# Patient Record
Sex: Male | Born: 1985 | Race: Black or African American | Hispanic: No | Marital: Single | Smoking: Current every day smoker
Health system: Southern US, Community
[De-identification: ages and names within clinical notes are randomized; demographics above are authoritative.]

## PROBLEM LIST (undated history)

## (undated) DIAGNOSIS — M549 Dorsalgia, unspecified: Secondary | ICD-10-CM

## (undated) DIAGNOSIS — R0602 Shortness of breath: Secondary | ICD-10-CM

## (undated) DIAGNOSIS — F259 Schizoaffective disorder, unspecified: Secondary | ICD-10-CM

## (undated) DIAGNOSIS — F250 Schizoaffective disorder, bipolar type: Secondary | ICD-10-CM

## (undated) DIAGNOSIS — E669 Obesity, unspecified: Secondary | ICD-10-CM

## (undated) DIAGNOSIS — I213 ST elevation (STEMI) myocardial infarction of unspecified site: Secondary | ICD-10-CM

## (undated) DIAGNOSIS — G8929 Other chronic pain: Secondary | ICD-10-CM

## (undated) DIAGNOSIS — F319 Bipolar disorder, unspecified: Secondary | ICD-10-CM

## (undated) DIAGNOSIS — M546 Pain in thoracic spine: Secondary | ICD-10-CM

## (undated) DIAGNOSIS — M1990 Unspecified osteoarthritis, unspecified site: Secondary | ICD-10-CM

---
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
## 2011-02-27 ENCOUNTER — Inpatient Hospital Stay (INDEPENDENT_AMBULATORY_CARE_PROVIDER_SITE_OTHER)
Admission: RE | Admit: 2011-02-27 | Discharge: 2011-02-27 | Disposition: A | Discharge status: Home / Self Care | Payer: Medicaid Other | Source: Ambulatory Visit | Attending: Family Medicine | Admitting: Family Medicine

## 2011-02-27 DIAGNOSIS — T300 Burn of unspecified body region, unspecified degree: Secondary | ICD-10-CM

## 2013-01-18 ENCOUNTER — Emergency Department (HOSPITAL_COMMUNITY)
Admission: EM | Admit: 2013-01-18 | Discharge: 2013-01-18 | Disposition: A | Discharge status: Home / Self Care | Payer: Medicaid Other | Attending: Emergency Medicine | Admitting: Emergency Medicine

## 2013-01-18 ENCOUNTER — Encounter (HOSPITAL_COMMUNITY): Payer: Self-pay | Admitting: Cardiology

## 2013-01-18 DIAGNOSIS — F17200 Nicotine dependence, unspecified, uncomplicated: Secondary | ICD-10-CM | POA: Insufficient documentation

## 2013-01-18 DIAGNOSIS — Y92410 Unspecified street and highway as the place of occurrence of the external cause: Secondary | ICD-10-CM | POA: Insufficient documentation

## 2013-01-18 DIAGNOSIS — S8990XA Unspecified injury of unspecified lower leg, initial encounter: Secondary | ICD-10-CM | POA: Insufficient documentation

## 2013-01-18 DIAGNOSIS — M549 Dorsalgia, unspecified: Secondary | ICD-10-CM

## 2013-01-18 DIAGNOSIS — Y9389 Activity, other specified: Secondary | ICD-10-CM | POA: Insufficient documentation

## 2013-01-18 DIAGNOSIS — S99919A Unspecified injury of unspecified ankle, initial encounter: Secondary | ICD-10-CM | POA: Insufficient documentation

## 2013-01-18 DIAGNOSIS — IMO0002 Reserved for concepts with insufficient information to code with codable children: Secondary | ICD-10-CM | POA: Insufficient documentation

## 2013-01-18 DIAGNOSIS — S0993XA Unspecified injury of face, initial encounter: Secondary | ICD-10-CM | POA: Insufficient documentation

## 2013-01-18 MED ORDER — METHOCARBAMOL 500 MG PO TABS: 500.0000 mg | ORAL_TABLET | Freq: Two times a day (BID) | ORAL | Status: DC | PRN

## 2013-01-18 MED ORDER — HYDROCODONE-ACETAMINOPHEN 5-325 MG PO TABS: 1.0000 | ORAL_TABLET | ORAL | Status: DC | PRN
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
## 2013-01-19 NOTE — ED Provider Notes (Signed)
Medical screening examination/treatment/procedure(s) were performed by non-physician practitioner and as supervising physician I was immediately available for consultation/collaboration.  [name], MD 01/19/13 [phone]

## 2013-02-18 ENCOUNTER — Encounter (HOSPITAL_COMMUNITY): Payer: Self-pay | Admitting: Emergency Medicine

## 2013-02-18 ENCOUNTER — Emergency Department (HOSPITAL_COMMUNITY): Payer: Medicaid Other

## 2013-02-18 ENCOUNTER — Observation Stay (HOSPITAL_COMMUNITY)
Admission: EM | Admit: 2013-02-18 | Discharge: 2013-02-20 | Disposition: A | Discharge status: Home / Self Care | Payer: Medicaid Other | Attending: Internal Medicine | Admitting: Internal Medicine

## 2013-02-18 DIAGNOSIS — F17200 Nicotine dependence, unspecified, uncomplicated: Secondary | ICD-10-CM

## 2013-02-18 DIAGNOSIS — F1010 Alcohol abuse, uncomplicated: Secondary | ICD-10-CM | POA: Insufficient documentation

## 2013-02-18 DIAGNOSIS — I2510 Atherosclerotic heart disease of native coronary artery without angina pectoris: Secondary | ICD-10-CM | POA: Insufficient documentation

## 2013-02-18 DIAGNOSIS — Z720 Tobacco use: Secondary | ICD-10-CM

## 2013-02-18 DIAGNOSIS — R7989 Other specified abnormal findings of blood chemistry: Secondary | ICD-10-CM | POA: Diagnosis present

## 2013-02-18 DIAGNOSIS — M549 Dorsalgia, unspecified: Secondary | ICD-10-CM

## 2013-02-18 DIAGNOSIS — S20219A Contusion of unspecified front wall of thorax, initial encounter: Secondary | ICD-10-CM

## 2013-02-18 DIAGNOSIS — F1210 Cannabis abuse, uncomplicated: Secondary | ICD-10-CM | POA: Insufficient documentation

## 2013-02-18 DIAGNOSIS — Z6839 Body mass index (BMI) 39.0-39.9, adult: Secondary | ICD-10-CM | POA: Insufficient documentation

## 2013-02-18 DIAGNOSIS — R748 Abnormal levels of other serum enzymes: Secondary | ICD-10-CM | POA: Insufficient documentation

## 2013-02-18 DIAGNOSIS — S2691XA Contusion of heart, unspecified with or without hemopericardium, initial encounter: Secondary | ICD-10-CM

## 2013-02-18 DIAGNOSIS — R072 Precordial pain: Secondary | ICD-10-CM | POA: Insufficient documentation

## 2013-02-18 DIAGNOSIS — D751 Secondary polycythemia: Secondary | ICD-10-CM | POA: Insufficient documentation

## 2013-02-18 DIAGNOSIS — I219 Acute myocardial infarction, unspecified: Principal | ICD-10-CM | POA: Insufficient documentation

## 2013-02-18 DIAGNOSIS — Z8659 Personal history of other mental and behavioral disorders: Secondary | ICD-10-CM

## 2013-02-18 DIAGNOSIS — S2611XD Contusion of heart without hemopericardium, subsequent encounter: Secondary | ICD-10-CM

## 2013-02-18 DIAGNOSIS — F3110 Bipolar disorder, current episode manic without psychotic features, unspecified: Secondary | ICD-10-CM | POA: Insufficient documentation

## 2013-02-18 DIAGNOSIS — F319 Bipolar disorder, unspecified: Secondary | ICD-10-CM | POA: Insufficient documentation

## 2013-02-18 DIAGNOSIS — I213 ST elevation (STEMI) myocardial infarction of unspecified site: Secondary | ICD-10-CM

## 2013-02-18 DIAGNOSIS — F1290 Cannabis use, unspecified, uncomplicated: Secondary | ICD-10-CM | POA: Diagnosis present

## 2013-02-18 DIAGNOSIS — R0789 Other chest pain: Secondary | ICD-10-CM

## 2013-02-18 DIAGNOSIS — E669 Obesity, unspecified: Secondary | ICD-10-CM | POA: Diagnosis present

## 2013-02-18 DIAGNOSIS — E860 Dehydration: Secondary | ICD-10-CM | POA: Insufficient documentation

## 2013-02-18 HISTORY — DX: ST elevation (STEMI) myocardial infarction of unspecified site: I21.3

## 2013-02-18 HISTORY — DX: Dorsalgia, unspecified: M54.9

## 2013-02-18 HISTORY — DX: Bipolar disorder, unspecified: F31.9

## 2013-02-18 HISTORY — DX: Schizoaffective disorder, unspecified: F25.9

## 2013-02-18 HISTORY — DX: Schizoaffective disorder, bipolar type: F25.0

## 2013-02-18 HISTORY — DX: Unspecified osteoarthritis, unspecified site: M19.90

## 2013-02-18 HISTORY — DX: Other chronic pain: G89.29

## 2013-02-18 HISTORY — DX: Pain in thoracic spine: M54.6

## 2013-02-18 HISTORY — DX: Shortness of breath: R06.02

## 2013-02-18 HISTORY — DX: Obesity, unspecified: E66.9

## 2013-02-18 LAB — POCT I-STAT TROPONIN I

## 2013-02-18 LAB — RAPID URINE DRUG SCREEN, HOSP PERFORMED
Barbiturates: NOT DETECTED
Benzodiazepines: NOT DETECTED
Cocaine: NOT DETECTED
Opiates: NOT DETECTED

## 2013-02-18 LAB — POCT I-STAT, CHEM 8
BUN: 7 mg/dL (ref 6–23)
Calcium, Ion: 1.1 mmol/L — ABNORMAL LOW (ref 1.12–1.23)
Chloride: 103 mEq/L (ref 96–112)
Creatinine, Ser: 1 mg/dL (ref 0.50–1.35)
Glucose, Bld: 124 mg/dL — ABNORMAL HIGH (ref 70–99)
TCO2: 26 mmol/L (ref 0–100)

## 2013-02-18 LAB — TROPONIN I: Troponin I: 1.58 ng/mL (ref <0.30)

## 2013-02-18 MED ORDER — METHOCARBAMOL 500 MG PO TABS
500.0000 mg | ORAL_TABLET | Freq: Two times a day (BID) | ORAL | Status: DC | PRN
  Administered 2013-02-19: 500 mg via ORAL
  Filled 2013-02-18: qty 1

## 2013-02-18 MED ORDER — SODIUM CHLORIDE 0.9 % IJ SOLN: 3.0000 mL | Freq: Two times a day (BID) | INTRAMUSCULAR | Status: DC

## 2013-02-18 MED ORDER — SODIUM CHLORIDE 0.9 % IV BOLUS (SEPSIS)
1000.0000 mL | Freq: Once | INTRAVENOUS | Status: DC
  Administered 2013-02-18: 1000 mL via INTRAVENOUS

## 2013-02-18 MED ORDER — RISPERIDONE MICROSPHERES 25 MG IM SUSR: 25.0000 mg | INTRAMUSCULAR | Status: DC

## 2013-02-18 MED ORDER — IOHEXOL 300 MG/ML  SOLN
100.0000 mL | Freq: Once | INTRAMUSCULAR | Status: AC | PRN
  Administered 2013-02-18: 100 mL via INTRAVENOUS
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
## 2013-02-18 NOTE — ED Notes (Signed)
Act  at bedside. 
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
## 2013-02-18 NOTE — [facility] Assessment (Signed)
[facility] Assessment Progress Note   This clinician did talk to patient regarding his services as requested by the PA.  Patient said that he received ACTT team services from [facility] and gave verbal permission to call them.  Clinician did call them and spoke to [employer].  She verified that patient is up to date with psychiatric medications and is compliant with services that they provide.  Patient was last seen by Megan yesterday morning (04/28) briefly and she said that she also talked to patient's mother today.  Patient has a appt with his PCP later in the week that they will take him to.  Patient denies any SI or HI to this clinician.
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
## 2013-02-18 NOTE — ED Notes (Signed)
MD at bedside. 
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
## 2013-02-18 NOTE — ED Notes (Addendum)
Elevated i stat troponin results reported to Dr. Manus Gunning by B. Bing Plume, EMT (1.17ng/mL)
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
## 2013-02-18 NOTE — ED Notes (Signed)
IV access attempted x2.  Second RN in to attempt.

## 2013-02-18 NOTE — ED Notes (Signed)
1st EKG was performed at 1414 by Steward Drone, RN, shown to [place], MD and was asked to repeat the EKG; I repeated the EKG and the 2nd was performed by me at 1422 and shown to [place], MD

## 2013-02-19 ENCOUNTER — Encounter (HOSPITAL_COMMUNITY): Payer: Self-pay | Admitting: General Practice

## 2013-02-19 DIAGNOSIS — Z5189 Encounter for other specified aftercare: Secondary | ICD-10-CM

## 2013-02-19 DIAGNOSIS — R7989 Other specified abnormal findings of blood chemistry: Secondary | ICD-10-CM

## 2013-02-19 DIAGNOSIS — S2691XA Contusion of heart, unspecified with or without hemopericardium, initial encounter: Secondary | ICD-10-CM

## 2013-02-19 DIAGNOSIS — S20219A Contusion of unspecified front wall of thorax, initial encounter: Secondary | ICD-10-CM

## 2013-02-19 DIAGNOSIS — I517 Cardiomegaly: Secondary | ICD-10-CM

## 2013-02-19 DIAGNOSIS — R071 Chest pain on breathing: Secondary | ICD-10-CM

## 2013-02-19 DIAGNOSIS — F17200 Nicotine dependence, unspecified, uncomplicated: Secondary | ICD-10-CM

## 2013-02-19 DIAGNOSIS — F1290 Cannabis use, unspecified, uncomplicated: Secondary | ICD-10-CM | POA: Diagnosis present

## 2013-02-19 DIAGNOSIS — Z720 Tobacco use: Secondary | ICD-10-CM | POA: Diagnosis present

## 2013-02-19 DIAGNOSIS — Z8659 Personal history of other mental and behavioral disorders: Secondary | ICD-10-CM

## 2013-02-19 LAB — CK TOTAL AND CKMB (NOT AT ARMC)
CK, MB: 58.1 ng/mL (ref 0.3–4.0)
Relative Index: 4.5 — ABNORMAL HIGH (ref 0.0–2.5)
Total CK: 1294 U/L — ABNORMAL HIGH (ref 7–232)

## 2013-02-19 LAB — BASIC METABOLIC PANEL
CO2: 26 mEq/L (ref 19–32)
Calcium: 8.9 mg/dL (ref 8.4–10.5)
Chloride: 103 mEq/L (ref 96–112)
Creatinine, Ser: 0.98 mg/dL (ref 0.50–1.35)
Glucose, Bld: 124 mg/dL — ABNORMAL HIGH (ref 70–99)

## 2013-02-19 LAB — CBC
Hemoglobin: 16.1 g/dL (ref 13.0–17.0)
MCH: 28.1 pg (ref 26.0–34.0)
MCV: 80.9 fL (ref 78.0–100.0)
Platelets: 262 10*3/uL (ref 150–400)
RBC: 5.72 MIL/uL (ref 4.22–5.81)
WBC: 12.1 10*3/uL — ABNORMAL HIGH (ref 4.0–10.5)

## 2013-02-19 LAB — TROPONIN I
Troponin I: 13.49 ng/mL (ref <0.30)
Troponin I: 14.43 ng/mL (ref <0.30)

## 2013-02-19 MED ORDER — POTASSIUM CHLORIDE CRYS ER 20 MEQ PO TBCR
20.0000 meq | EXTENDED_RELEASE_TABLET | Freq: Two times a day (BID) | ORAL | Status: AC
  Administered 2013-02-19 (×2): 20 meq via ORAL
  Filled 2013-02-19 (×2): qty 1

## 2013-02-19 MED ORDER — POTASSIUM CHLORIDE IN NACL 20-0.45 MEQ/L-% IV SOLN
INTRAVENOUS | Status: DC
  Administered 2013-02-19: 09:00:00 via INTRAVENOUS
  Filled 2013-02-19 (×6): qty 1000
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
## 2013-02-19 NOTE — Progress Notes (Signed)
  Echocardiogram 2D Echocardiogram has been performed.  Georgian Co 02/19/2013, 10:19 AM

## 2013-02-19 NOTE — Progress Notes (Signed)
Resulted labs text messaged to Dr Sherrie Mustache, Troponin 14.43, CK 1294, CKMB 58.1

## 2013-02-19 NOTE — ED Provider Notes (Signed)
I was available for the conclusion of this patient's care.  Please see the initial physician note for additional information about the patient's course.  Gerhard Munch, MD 02/19/13 0000

## 2013-02-19 NOTE — Progress Notes (Signed)
Utilization review completed.  

## 2013-02-20 ENCOUNTER — Encounter (HOSPITAL_COMMUNITY)
Admission: EM | Disposition: A | Discharge status: Home / Self Care | Payer: Self-pay | Source: Home / Self Care | Attending: Emergency Medicine

## 2013-02-20 ENCOUNTER — Telehealth: Payer: Self-pay | Admitting: Physician Assistant

## 2013-02-20 ENCOUNTER — Encounter (HOSPITAL_COMMUNITY): Payer: Self-pay | Admitting: Internal Medicine

## 2013-02-20 DIAGNOSIS — I2510 Atherosclerotic heart disease of native coronary artery without angina pectoris: Secondary | ICD-10-CM

## 2013-02-20 DIAGNOSIS — I213 ST elevation (STEMI) myocardial infarction of unspecified site: Secondary | ICD-10-CM

## 2013-02-20 DIAGNOSIS — E669 Obesity, unspecified: Secondary | ICD-10-CM

## 2013-02-20 DIAGNOSIS — M549 Dorsalgia, unspecified: Secondary | ICD-10-CM

## 2013-02-20 DIAGNOSIS — I10 Essential (primary) hypertension: Secondary | ICD-10-CM

## 2013-02-20 HISTORY — DX: ST elevation (STEMI) myocardial infarction of unspecified site: I21.3

## 2013-02-20 HISTORY — PX: LEFT HEART CATHETERIZATION WITH CORONARY ANGIOGRAM: SHX5451

## 2013-02-20 HISTORY — DX: Obesity, unspecified: E66.9

## 2013-02-20 LAB — BASIC METABOLIC PANEL
BUN: 6 mg/dL (ref 6–23)
Chloride: 103 mEq/L (ref 96–112)
Creatinine, Ser: 1.02 mg/dL (ref 0.50–1.35)
GFR calc Af Amer: >90 mL/min (ref >90)
GFR calc non Af Amer: >90 mL/min (ref >90)
Potassium: 3.9 mEq/L (ref 3.5–5.1)

## 2013-02-20 LAB — CBC
MCHC: 33.8 g/dL (ref 30.0–36.0)
Platelets: 256 10*3/uL (ref 150–400)
RDW: 14 % (ref 11.5–15.5)
WBC: 10.5 10*3/uL (ref 4.0–10.5)

## 2013-02-20 LAB — CK TOTAL AND CKMB (NOT AT ARMC): Total CK: 619 U/L — ABNORMAL HIGH (ref 7–232)

## 2013-02-20 LAB — PROTIME-INR: Prothrombin Time: 13.2 seconds (ref 11.6–15.2)

## 2013-02-20 SURGERY — LEFT HEART CATHETERIZATION WITH CORONARY ANGIOGRAM
Anesthesia: LOCAL

## 2013-02-20 MED ORDER — FENTANYL CITRATE 0.05 MG/ML IJ SOLN
INTRAMUSCULAR | Status: AC
  Filled 2013-02-20: qty 2

## 2013-02-20 MED ORDER — HEPARIN (PORCINE) IN NACL 2-0.9 UNIT/ML-% IJ SOLN
INTRAMUSCULAR | Status: AC
  Filled 2013-02-20: qty 1500

## 2013-02-20 MED ORDER — LOSARTAN POTASSIUM 50 MG PO TABS
50.0000 mg | ORAL_TABLET | Freq: Every day | ORAL | Status: DC
  Filled 2013-02-20: qty 1

## 2013-02-20 MED ORDER — ASPIRIN 81 MG PO CHEW
324.0000 mg | CHEWABLE_TABLET | ORAL | Status: DC
Start: 2013-02-21 — End: 2013-02-20

## 2013-02-20 MED ORDER — SODIUM CHLORIDE 0.9 % IJ SOLN: 3.0000 mL | INTRAMUSCULAR | Status: DC | PRN

## 2013-02-20 MED ORDER — METOPROLOL SUCCINATE ER 25 MG PO TB24: 50.0000 mg | ORAL_TABLET | Freq: Every day | ORAL | Status: DC

## 2013-02-20 MED ORDER — SODIUM CHLORIDE 0.9 % IJ SOLN: 3.0000 mL | Freq: Two times a day (BID) | INTRAMUSCULAR | Status: DC

## 2013-02-20 MED ORDER — VERAPAMIL HCL 2.5 MG/ML IV SOLN
INTRAVENOUS | Status: AC
  Filled 2013-02-20: qty 2

## 2013-02-20 MED ORDER — ONDANSETRON HCL 4 MG/2ML IJ SOLN: 4.0000 mg | Freq: Four times a day (QID) | INTRAMUSCULAR | Status: DC | PRN

## 2013-02-20 MED ORDER — HEPARIN SODIUM (PORCINE) 1000 UNIT/ML IJ SOLN
INTRAMUSCULAR | Status: AC
  Filled 2013-02-20: qty 1

## 2013-02-20 MED ORDER — SODIUM CHLORIDE 0.9 % IV SOLN: 250.0000 mL | INTRAVENOUS | Status: DC | PRN

## 2013-02-20 MED ORDER — METOPROLOL SUCCINATE ER 25 MG PO TB24
25.0000 mg | ORAL_TABLET | Freq: Every day | ORAL | Status: DC
  Filled 2013-02-20: qty 1

## 2013-02-20 MED ORDER — ASPIRIN 81 MG PO CHEW: 81.0000 mg | CHEWABLE_TABLET | Freq: Every day | ORAL | Status: DC

## 2013-02-20 MED ORDER — LIDOCAINE HCL (PF) 1 % IJ SOLN
INTRAMUSCULAR | Status: AC
  Filled 2013-02-20: qty 30

## 2013-02-20 MED ORDER — ACETAMINOPHEN 325 MG PO TABS: 650.0000 mg | ORAL_TABLET | ORAL | Status: DC | PRN

## 2013-02-20 MED ORDER — ASPIRIN 81 MG PO CHEW
81.0000 mg | CHEWABLE_TABLET | Freq: Every day | ORAL | Status: DC
Start: 2013-02-20 — End: 2013-02-20
  Administered 2013-02-20: 81 mg via ORAL
  Filled 2013-02-20: qty 1

## 2013-02-20 MED ORDER — SODIUM CHLORIDE 0.9 % IV SOLN
1.0000 mL/kg/h | INTRAVENOUS | Status: DC
  Administered 2013-02-20: 1 mL/kg/h via INTRAVENOUS

## 2013-02-20 MED ORDER — MIDAZOLAM HCL 2 MG/2ML IJ SOLN
INTRAMUSCULAR | Status: AC
  Filled 2013-02-20: qty 2

## 2013-02-20 NOTE — Progress Notes (Signed)
Nutrition Brief Note  Patient identified on the Malnutrition Screening Tool (MST) Report for recent weight loss without trying; desirable given obesity.  Body mass index is 39.15 kg/(m^2). Patient meets criteria for Obesity Class II based on current BMI.   Current diet order is Regular, patient is consuming approximately 100% of meals at this time. Labs and medications reviewed.   No nutrition interventions warranted at this time. If nutrition issues arise, please consult RD.   Maureen Chatters, RD, LDN Pager #: [phone] After-Hours Pager #: [phone]
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
## 2013-02-20 NOTE — Progress Notes (Signed)
CSW was referred this pt to speak to him about seeing a counselor regarding anger issues with his mom or any other family members. We talked about the positive THC and amphetamines that were in his system. The pt didn't respond to the positive drugs found in his labs.  Pt then stated that he needed to go to the bathroom, CSW left the room.   Sherald Barge, LCSW-A Clinical Social Worker [phone]

## 2013-02-20 NOTE — Telephone Encounter (Signed)
New problem   14 day TCM made with Tereso Newcomer per Thayer Ohm calling/seen for Dr [name] on 03/06/13 @ 3:20.

## 2013-02-20 NOTE — CV Procedure (Signed)
   Cardiac Catheterization Procedure Note  Name: Jake Sanchez MRN: [id number] DOB: 05-27-1986  Procedure: Left Heart Cath, Selective Coronary Angiography, LV angiography  Indication: elevated troponin   Procedural Details: The right wrist was prepped, draped, and anesthetized with 1% lidocaine. Using the modified Seldinger technique, a 5 French sheath was introduced into the right radial artery. 3 mg of verapamil was administered through the sheath, weight-based unfractionated heparin was administered intravenously. Standard Judkins catheters were used for selective coronary angiography and left ventriculography. Catheter exchanges were performed over an exchange length guidewire. There were no immediate procedural complications. A TR band was used for radial hemostasis at the completion of the procedure.  The patient was transferred to the post catheterization recovery area for further monitoring.  Procedural Findings: Hemodynamics: AO 126/79 LV 137/13  Coronary angiography: Coronary dominance: right  Left mainstem: Widely patent, no obstructive disease.  Left anterior descending (LAD): Widely patent to the LV apex. There is nonobstructive 40-50% stenosis after the first diagonal branch of the LAD.  Left circumflex (LCx): Widely patent with 2 OM branches. No obstructive disease.  Right coronary artery (RCA): Dominant, patent throughout.  Left ventriculography: Left ventricular systolic function is hyperdynamic, LVEF is estimated at 70%, there is no significant mitral regurgitation   Final Conclusions:   1. Nonobstructive LAD stenosis 2. Wide patency of the LCx and RCA 3. Hyperdynamic LV function  Recommendations: Med Rx for nonobstructive CAD.  Tonny Bollman 02/20/2013, 9:45 AM

## 2013-02-21 NOTE — Telephone Encounter (Signed)
TCM management call.  No answer/no voicemail.

## 2013-02-22 ENCOUNTER — Encounter (HOSPITAL_COMMUNITY): Payer: Self-pay

## 2013-02-22 ENCOUNTER — Emergency Department (HOSPITAL_COMMUNITY)
Admission: EM | Admit: 2013-02-22 | Discharge: 2013-02-23 | Disposition: A | Discharge status: Home / Self Care | Payer: No Typology Code available for payment source | Attending: Emergency Medicine | Admitting: Emergency Medicine

## 2013-02-22 DIAGNOSIS — M546 Pain in thoracic spine: Secondary | ICD-10-CM | POA: Insufficient documentation

## 2013-02-22 DIAGNOSIS — F319 Bipolar disorder, unspecified: Secondary | ICD-10-CM | POA: Insufficient documentation

## 2013-02-22 DIAGNOSIS — M549 Dorsalgia, unspecified: Secondary | ICD-10-CM | POA: Insufficient documentation

## 2013-02-22 DIAGNOSIS — R0602 Shortness of breath: Secondary | ICD-10-CM | POA: Insufficient documentation

## 2013-02-22 DIAGNOSIS — F259 Schizoaffective disorder, unspecified: Secondary | ICD-10-CM | POA: Insufficient documentation

## 2013-02-22 DIAGNOSIS — E669 Obesity, unspecified: Secondary | ICD-10-CM | POA: Insufficient documentation

## 2013-02-22 DIAGNOSIS — Z79899 Other long term (current) drug therapy: Secondary | ICD-10-CM | POA: Insufficient documentation

## 2013-02-22 DIAGNOSIS — Y998 Other external cause status: Secondary | ICD-10-CM | POA: Insufficient documentation

## 2013-02-22 DIAGNOSIS — IMO0001 Reserved for inherently not codable concepts without codable children: Secondary | ICD-10-CM | POA: Insufficient documentation

## 2013-02-22 DIAGNOSIS — M129 Arthropathy, unspecified: Secondary | ICD-10-CM | POA: Insufficient documentation

## 2013-02-22 DIAGNOSIS — I219 Acute myocardial infarction, unspecified: Secondary | ICD-10-CM | POA: Insufficient documentation

## 2013-02-22 DIAGNOSIS — Z880 Allergy status to penicillin: Secondary | ICD-10-CM | POA: Insufficient documentation

## 2013-02-22 DIAGNOSIS — G8929 Other chronic pain: Secondary | ICD-10-CM | POA: Insufficient documentation

## 2013-02-22 DIAGNOSIS — F17200 Nicotine dependence, unspecified, uncomplicated: Secondary | ICD-10-CM | POA: Insufficient documentation

## 2013-02-22 DIAGNOSIS — Z7982 Long term (current) use of aspirin: Secondary | ICD-10-CM | POA: Insufficient documentation

## 2013-02-22 DIAGNOSIS — Y92410 Unspecified street and highway as the place of occurrence of the external cause: Secondary | ICD-10-CM | POA: Insufficient documentation

## 2013-02-22 DIAGNOSIS — M542 Cervicalgia: Secondary | ICD-10-CM | POA: Insufficient documentation
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
## 2013-02-23 MED ORDER — NAPROXEN 500 MG PO TABS: 500.0000 mg | ORAL_TABLET | Freq: Two times a day (BID) | ORAL | Status: DC

## 2013-02-23 NOTE — ED Provider Notes (Signed)
Medical screening examination/treatment/procedure(s) were performed by non-physician practitioner and as supervising physician I was immediately available for consultation/collaboration.  Doug Sou, MD 02/23/13 [phone]
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
## 2013-02-24 NOTE — Telephone Encounter (Signed)
Attempted to call patient.  Patient's VM has not been set up and so I was unable to LM for patient to call us back; there are no other numbers for patient.  Patient has requested no medical information to be given to anyone without his permission so I did not call patient's mother who is listed as emergency contact.

## 2013-02-24 NOTE — Telephone Encounter (Signed)
Tried to call again at 4:30 and got same message - that patient's VM has not been set up

## 2013-03-06 ENCOUNTER — Encounter: Payer: Medicaid Other | Admitting: Physician Assistant

## 2013-05-20 ENCOUNTER — Encounter: Payer: Self-pay | Admitting: Internal Medicine
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
## 2014-10-01 ENCOUNTER — Encounter (HOSPITAL_COMMUNITY): Payer: Self-pay | Admitting: Cardiovascular Disease

## 2016-01-31 ENCOUNTER — Encounter (HOSPITAL_COMMUNITY): Payer: Self-pay | Admitting: *Deleted

## 2016-01-31 ENCOUNTER — Ambulatory Visit (HOSPITAL_COMMUNITY)
Admission: EM | Admit: 2016-01-31 | Discharge: 2016-01-31 | Disposition: A | Discharge status: Home / Self Care | Payer: Medicaid Other | Attending: Emergency Medicine | Admitting: Emergency Medicine

## 2016-01-31 DIAGNOSIS — L293 Anogenital pruritus, unspecified: Secondary | ICD-10-CM

## 2016-01-31 DIAGNOSIS — L290 Pruritus ani: Secondary | ICD-10-CM

## 2016-01-31 MED ORDER — HYDROCORTISONE 2.5 % RE CREA: TOPICAL_CREAM | RECTAL | Status: DC
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
## 2016-01-31 NOTE — ED Notes (Signed)
Pt  Reports     He has  Had  A  History  Of  hpv      In  Past     He  Has  Warts  In his  Perineal  Area   He  Reports  He  Has  Had  Them  Burned  Off    In  Past   he  Reports  Some  Rectal bleeding  That  He  Noticed  Last  Pm

## 2016-01-31 NOTE — Discharge Instructions (Signed)
My apologies that you were in convinced today, with the impression that the service you are requesting is performed at this center.  A rx for anti itching cream has been provided to you  You [facility] as soon as your medicaid comes through.   You may also use witch hazel to the itching area to help with symptoms.    Anal Pruritus Anal pruritus is when the area around the opening of the butt (anus) feels itchy. This itchy feeling often happens when the area is moist. Moisture may come from sweat or from a small amount of poop (stool) that is left on the area. Scratching can cause more skin damage. HOME CARE Pay attention to any changes in your condition. Take these actions to help with your itching: Skin Care  Keep clean by washing your body well.  Clean the affected area with wet toilet paper, baby wipes, or a wet washcloth. Do this after you poop and at bedtime.  Avoid using soaps on this area.  Dry the area well. Pat the area dry.  Do not scrub the area with anything, even toilet paper.  Do not scratch the itchy area. Scratching makes the itching worse.  Take a warm water bath (sitz bath) as told by your doctor. Pat the area dry with a soft cloth.  Use creams or ointments as told by your doctor.  Do not use bubble bath, scented toilet paper, or genital deodorants. General Instructions  Take over-the-counter and prescription medicines only as told by your doctor.  Talk with your doctor about fiber pills (supplements).  Wear cotton underwear and loose clothing.  Keep all follow-up visits as told by your doctor. This is important. GET HELP IF:  Your itching does not get better after many days.  Your itching gets worse.  You have a fever.  You have redness, swelling, or pain in the itchy area.  You have fluid, blood, or pus coming from the itchy area.   This information is not intended to replace advice given to you by your health care  provider. Make sure you discuss any questions you have with your health care provider.   Document Released: 06/21/2011 Document Revised: 06/30/2015 Document Reviewed: 01/04/2015 Elsevier Interactive Patient Education [employer]2016 Elsevier Inc.

## 2016-03-03 ENCOUNTER — Encounter (HOSPITAL_COMMUNITY): Payer: Self-pay | Admitting: *Deleted

## 2016-03-03 ENCOUNTER — Emergency Department (HOSPITAL_COMMUNITY)
Admission: EM | Admit: 2016-03-03 | Discharge: 2016-03-04 | Disposition: A | Discharge status: Home / Self Care | Payer: No Typology Code available for payment source | Attending: Emergency Medicine | Admitting: Emergency Medicine

## 2016-03-03 DIAGNOSIS — I252 Old myocardial infarction: Secondary | ICD-10-CM | POA: Insufficient documentation

## 2016-03-03 DIAGNOSIS — F250 Schizoaffective disorder, bipolar type: Secondary | ICD-10-CM

## 2016-03-03 DIAGNOSIS — E669 Obesity, unspecified: Secondary | ICD-10-CM | POA: Insufficient documentation

## 2016-03-03 DIAGNOSIS — M1990 Unspecified osteoarthritis, unspecified site: Secondary | ICD-10-CM | POA: Insufficient documentation

## 2016-03-03 DIAGNOSIS — F259 Schizoaffective disorder, unspecified: Secondary | ICD-10-CM | POA: Insufficient documentation

## 2016-03-03 DIAGNOSIS — Z79899 Other long term (current) drug therapy: Secondary | ICD-10-CM | POA: Insufficient documentation

## 2016-03-03 DIAGNOSIS — Z6838 Body mass index (BMI) 38.0-38.9, adult: Secondary | ICD-10-CM | POA: Insufficient documentation

## 2016-03-03 DIAGNOSIS — F319 Bipolar disorder, unspecified: Secondary | ICD-10-CM | POA: Insufficient documentation

## 2016-03-03 DIAGNOSIS — Z7982 Long term (current) use of aspirin: Secondary | ICD-10-CM | POA: Insufficient documentation

## 2016-03-03 DIAGNOSIS — F22 Delusional disorders: Secondary | ICD-10-CM | POA: Insufficient documentation

## 2016-03-03 DIAGNOSIS — F17210 Nicotine dependence, cigarettes, uncomplicated: Secondary | ICD-10-CM | POA: Insufficient documentation

## 2016-03-03 LAB — CBC WITH DIFFERENTIAL/PLATELET
Basophils Absolute: 0 10*3/uL (ref 0.0–0.1)
Basophils Relative: 0 %
Eosinophils Absolute: 0.2 10*3/uL (ref 0.0–0.7)
Eosinophils Relative: 1 %
HCT: 48.6 % (ref 39.0–52.0)
Hemoglobin: 16.7 g/dL (ref 13.0–17.0)
Lymphocytes Relative: 18 %
Lymphs Abs: 2.9 10*3/uL (ref 0.7–4.0)
MCH: 28.6 pg (ref 26.0–34.0)
MCHC: 34.4 g/dL (ref 30.0–36.0)
MCV: 83.4 fL (ref 78.0–100.0)
Monocytes Absolute: 1.9 10*3/uL — ABNORMAL HIGH (ref 0.1–1.0)
Monocytes Relative: 12 %
Neutro Abs: 11 10*3/uL — ABNORMAL HIGH (ref 1.7–7.7)
Neutrophils Relative %: 69 %
Platelets: 246 10*3/uL (ref 150–400)
RBC: 5.83 MIL/uL — ABNORMAL HIGH (ref 4.22–5.81)
RDW: 14.1 % (ref 11.5–15.5)
WBC: 16 10*3/uL — ABNORMAL HIGH (ref 4.0–10.5)

## 2016-03-03 LAB — COMPREHENSIVE METABOLIC PANEL
ALT: 38 U/L (ref 17–63)
AST: 30 U/L (ref 15–41)
Albumin: 4.8 g/dL (ref 3.5–5.0)
Alkaline Phosphatase: 48 U/L (ref 38–126)
Anion gap: 12 (ref 5–15)
BUN: 8 mg/dL (ref 6–20)
CO2: 23 mmol/L (ref 22–32)
Calcium: 9.1 mg/dL (ref 8.9–10.3)
Chloride: 106 mmol/L (ref 101–111)
Creatinine, Ser: 1.14 mg/dL (ref 0.61–1.24)
GFR calc Af Amer: >60 mL/min (ref >60)
GFR calc non Af Amer: >60 mL/min (ref >60)
Glucose, Bld: 104 mg/dL — ABNORMAL HIGH (ref 65–99)
Potassium: 3.7 mmol/L (ref 3.5–5.1)
Sodium: 141 mmol/L (ref 135–145)
Total Bilirubin: 0.9 mg/dL (ref 0.3–1.2)
Total Protein: 7.9 g/dL (ref 6.5–8.1)

## 2016-03-03 LAB — ETHANOL: Alcohol, Ethyl (B): <5 mg/dL (ref <5)

## 2016-03-03 MED ORDER — ASPIRIN 81 MG PO CHEW
81.0000 mg | CHEWABLE_TABLET | Freq: Every day | ORAL | Status: DC
  Administered 2016-03-03 – 2016-03-04 (×2): 81 mg via ORAL
  Filled 2016-03-03 (×2): qty 1

## 2016-03-03 MED ORDER — METOPROLOL SUCCINATE ER 50 MG PO TB24
50.0000 mg | ORAL_TABLET | Freq: Every day | ORAL | Status: DC
  Filled 2016-03-03 (×3): qty 1
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
## 2016-03-03 NOTE — ED Notes (Signed)
Patient noted sleeping in room. No complaints, stable, in no acute distress. Q15 minute rounds and monitoring via Security Cameras to continue.  

## 2016-03-03 NOTE — ED Notes (Signed)
Report from Edie Marvin RN. Patient sleeping, respirations regular and unlabored. Q15 minute rounds and security camera observation to continue.    

## 2016-03-03 NOTE — ED Notes (Signed)
Pt oriented to room and unit.  Pt is very sleepy and was difficult to arouse. He dienies S/I, H/I, and AVH.  He is alert and oriented.  15 minute checks and video monitoring in place.
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
## 2016-03-03 NOTE — ED Notes (Addendum)
Per pt report: pt he hasn't been able to get his pysch medications.  Pt reports having hallucinations and feels that people are out to get him and following him.  Pt reports getting out of prison on Dec. 21, 2016.  [facility] Pt reports being up for 2 weeks and would really like his medication figured out.  Pt denies SI/HI.

## 2016-03-03 NOTE — BHH Counselor (Signed)
This Clinical research associatewriter faxed supporting documentation to the following facilities for patient placement: [facility]  Ardelle ParkLatoya McNeil, [place] Counsel[place]

## 2016-03-03 NOTE — ED Notes (Signed)
Patient noted in room. No complaints, stable, in no acute distress. Q15 minute rounds and monitoring via Security Cameras to continue.  

## 2016-03-04 DIAGNOSIS — F259 Schizoaffective disorder, unspecified: Secondary | ICD-10-CM

## 2016-03-04 DIAGNOSIS — F250 Schizoaffective disorder, bipolar type: Secondary | ICD-10-CM

## 2016-03-04 NOTE — ED Notes (Signed)
CSW into see 

## 2016-03-04 NOTE — ED Notes (Signed)
Patient noted sleeping in room. No complaints, stable, in no acute distress. Q15 minute rounds and monitoring via Security Cameras to continue.  

## 2016-03-04 NOTE — Clinical Social Work Note (Signed)
CSW met with pt to discuss discharge plans.  CSW provided pt with services that he can follow up on that will not require insurance. CSW also provided pt with a jail diversion program through [facility] that he can follow up with to gain additional services.  Pt is going to call his mother and arrange for a ride home as he is being discharged today  . , LCSW [facility] Clinical Social Worker - Weekend Coverage cell #: [phone] 

## 2016-03-04 NOTE — BH Assessment (Signed)
BHH Assessment Progress Note Patient will be D/Ced this date per Jonnalagadd MD and provided with outpatient resources.

## 2016-03-04 NOTE — Discharge Instructions (Signed)
Paranoia  Paranoia is a general mistrust of others. People with paranoia may feel as though people are "out to get them" and the world is against them without reason. They may be afraid of others or behave in an angry or hostile way toward others.  HOME CARE INSTRUCTIONS  Monitor your thoughts and mood for any changes. Take these steps to help with your condition:  · Take over-the-counter and prescription medicines only as told by your health care provider.  · Check with your health care provider before taking any herbs or supplements.  · Keep all follow-up visits as told by your health care provider. This is important.  · Maintain a healthy lifestyle:    Eat a healthy diet.    Exercise regularly.    Get plenty of sleep.  · Avoid alcohol and drugs.  · Learn ways to reduce stress and cope with stress, such as with yoga and meditation.  · Talk about your feelings with family members or health care providers.  · Make time for yourself to do things that you enjoy.  SEEK MEDICAL CARE IF:  · Medicines do not seem to be helping.  · You feel extremely fearful and suspicious that something will harm you.  · You feel hopeless and overwhelmed.  · You feel like you cannot leave your house.  · You have trouble taking care of yourself.  SEEK IMMEDIATE MEDICAL CARE IF:  · You have serious thoughts about hurting yourself or others.     This information is not intended to replace advice given to you by your health care provider. Make sure you discuss any questions you have with your health care provider.     Document Released: 10/12/2003 Document Revised: 02/23/2015 Document Reviewed: 08/16/2014  Elsevier Interactive Patient Education ©2016 Elsevier Inc.

## 2016-03-04 NOTE — BHH Suicide Risk Assessment (Cosign Needed)
Suicide Risk Assessment  Discharge Assessment   [facility]   Principal Problem: Bipolar disorder [facility](HCC) Discharge Diagnoses:  Patient Active Problem List   Diagnosis Date Noted  . Obesity [E66.9] 02/20/2013  . STEMI (ST elevation myocardial infarction) (HCC) [I21.3] 02/20/2013  . Myocardial contusion [S26.11XA] 02/19/2013  . Tobacco use [Z72.0] 02/19/2013  . Marijuana use [F12.10] 02/19/2013  . History of bipolar disorder [Z86.59] 02/19/2013  . Elevated troponin [R79.89] 02/18/2013  . Bipolar disorder (HCC) [F31.9]     Total Time spent with patient: 30 minutes  Musculoskeletal: Strength & Muscle Tone: within normal limits Gait & Station: normal Patient leans: N/A  Psychiatric Specialty Exam:   Blood pressure 108/60, pulse 67, temperature 98.6 F (37 C), temperature source Oral, resp. rate 17, height 5\' 7"  (1.702 m), weight 110.224 kg (243 lb), SpO2 98 %.Body mass index is 38.05 kg/(m^2).  General Appearance: Casual  Eye Contact::  Good  Speech:  Clear and Coherent409  Volume:  Normal  Mood:  Anxious  Affect:  Appropriate  Thought Process:  Goal Directed and Intact  Orientation:  Full (Time, Place, and Person)  Thought Content:  Concerns about affording injectable medications   Suicidal Thoughts:  No  Homicidal Thoughts:  No  Memory:  Immediate;   Good Recent;   Fair Remote;   Fair  Judgement:  Fair  Insight:  Shallow  Psychomotor Activity:  Normal  Concentration:  Good  Recall:  Good  Fund of Knowledge:Good  Language: Good  Akathisia:  No  Handed:  Right  AIMS (if indicated):     Assets:  Communication Skills Desire for Improvement Leisure Time Physical Health Resilience  Sleep:     Cognition: WNL  ADL's:  Intact   Mental Status Per Nursing Assessment::   On Admission:     Demographic Factors:  Male and Low socioeconomic status  Loss Factors: Financial problems/change in socioeconomic status  Historical  Factors: Impulsivity  Risk Reduction Factors:   Sense of responsibility to family and Religious beliefs about death  Continued Clinical Symptoms:  Bipolar Disorder:   Depressive phase  Cognitive Features That Contribute To Risk:  Polarized thinking    Suicide Risk:  Minimal: No identifiable suicidal ideation.  Patients presenting with no risk factors but with morbid ruminations; may be classified as minimal risk based on the severity of the depressive symptoms    Plan Of Care/Follow-up recommendations:  Patient to follow up with outpatient referrals for medication management.   Fransisca KaufmannAVIS, Saban Heinlen, NP 03/04/2016, 11:39 AM

## 2016-03-04 NOTE — ED Notes (Signed)
On the phone, calling for a ride

## 2016-03-04 NOTE — ED Notes (Addendum)
Written dcinstructions reviewed w/ pt.  Pt encouraged to follow up w/ OP referal provided by CSW in order to get back on his medications.  Pt also encouraged to seek treatment if AVH or thoughts to harm self or other returns. Pt verbalized understanding.  Pt ambulatory to dc area with mHt, belongings returned after leaving the area.

## 2016-03-04 NOTE — ED Notes (Signed)
Dr Shela CommonsJ and Vernona RiegerLaura NP into see.  Pt [facility] and that he is no linger able to get them w/o cost there because he lost his medicaid when he went to prison.  Pt reports that he came here to get the injection.  [facility] Pt reports that he has been taking injections since smoking laced pot in 2006.  Pt denies si/hi/avh/parinoia at this time.

## 2016-03-04 NOTE — ED Notes (Signed)
Up to the bathroom 

## 2016-03-04 NOTE — ED Notes (Signed)
CSW to see prior to dc

## 2016-03-04 NOTE — ED Notes (Signed)
Patient noted in rest room. No complaints, stable, in no acute distress. Q15 minute rounds and monitoring via Security Cameras to continue.  
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
## 2016-08-08 ENCOUNTER — Encounter (HOSPITAL_COMMUNITY): Payer: Self-pay | Admitting: *Deleted

## 2016-08-08 ENCOUNTER — Emergency Department (HOSPITAL_COMMUNITY)
Admission: EM | Admit: 2016-08-08 | Discharge: 2016-08-12 | Disposition: A | Discharge status: Home / Self Care | Payer: Federal, State, Local not specified - Other | Attending: Emergency Medicine | Admitting: Emergency Medicine

## 2016-08-08 DIAGNOSIS — R4689 Other symptoms and signs involving appearance and behavior: Secondary | ICD-10-CM

## 2016-08-08 DIAGNOSIS — F17210 Nicotine dependence, cigarettes, uncomplicated: Secondary | ICD-10-CM | POA: Insufficient documentation

## 2016-08-08 DIAGNOSIS — F918 Other conduct disorders: Secondary | ICD-10-CM | POA: Insufficient documentation

## 2016-08-08 DIAGNOSIS — F250 Schizoaffective disorder, bipolar type: Secondary | ICD-10-CM | POA: Insufficient documentation

## 2016-08-08 DIAGNOSIS — Z79891 Long term (current) use of opiate analgesic: Secondary | ICD-10-CM | POA: Insufficient documentation

## 2016-08-08 DIAGNOSIS — Z79899 Other long term (current) drug therapy: Secondary | ICD-10-CM | POA: Insufficient documentation

## 2016-08-08 LAB — CBC
HCT: 45.3 % (ref 39.0–52.0)
Hemoglobin: 15.7 g/dL (ref 13.0–17.0)
MCH: 27.9 pg (ref 26.0–34.0)
MCHC: 34.7 g/dL (ref 30.0–36.0)
MCV: 80.5 fL (ref 78.0–100.0)
PLATELETS: 319 10*3/uL (ref 150–400)
RBC: 5.63 MIL/uL (ref 4.22–5.81)
RDW: 14.6 % (ref 11.5–15.5)
WBC: 9.5 10*3/uL (ref 4.0–10.5)

## 2016-08-08 LAB — ETHANOL: Alcohol, Ethyl (B): <5 mg/dL (ref <5)

## 2016-08-08 LAB — COMPREHENSIVE METABOLIC PANEL
ALBUMIN: 4.4 g/dL (ref 3.5–5.0)
ALK PHOS: 44 U/L (ref 38–126)
ALT: 23 U/L (ref 17–63)
AST: 21 U/L (ref 15–41)
Anion gap: 9 (ref 5–15)
BILIRUBIN TOTAL: 0.8 mg/dL (ref 0.3–1.2)
BUN: 12 mg/dL (ref 6–20)
CALCIUM: 9 mg/dL (ref 8.9–10.3)
CO2: 23 mmol/L (ref 22–32)
Chloride: 107 mmol/L (ref 101–111)
Creatinine, Ser: 1.02 mg/dL (ref 0.61–1.24)
GFR calc Af Amer: >60 mL/min (ref >60)
GFR calc non Af Amer: >60 mL/min (ref >60)
GLUCOSE: 82 mg/dL (ref 65–99)
Potassium: 3.7 mmol/L (ref 3.5–5.1)
Sodium: 139 mmol/L (ref 135–145)
TOTAL PROTEIN: 8 g/dL (ref 6.5–8.1)

## 2016-08-08 LAB — RAPID URINE DRUG SCREEN, HOSP PERFORMED
Amphetamines: NOT DETECTED
Barbiturates: NOT DETECTED
Benzodiazepines: NOT DETECTED
Cocaine: NOT DETECTED
OPIATES: NOT DETECTED
TETRAHYDROCANNABINOL: POSITIVE — AB

## 2016-08-08 NOTE — ED Notes (Signed)
Patient denies SI/HI and states he is just here to "be checked out."

## 2016-08-08 NOTE — Progress Notes (Signed)
Patient listed as  Not having a pap or insurance living in [place].  EDCM provided patient with contact information to [facility], informed patient of services there and walk in times.  EDCM also provided patient with list of pcps who accept self pay patients, list of discount pharmacies and websites needymeds.org and GoodRX.com for medication assistance, phone number to inquire about the orange card, phone number to inquire about Medicaid, phone number to inquire about the Affordable Care Act, financial resources in the community such as local churches, salvation army, urban ministries, and dental assistance for uninsured patients.   No further EDCM needs at this time.  This information was placed in patient's belongings bag at the nurse's station. 

## 2016-08-08 NOTE — ED Notes (Signed)
Bed: [facility] Expected date:  Expected time:  Means of arrival:  Comments: 
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
## 2016-08-08 NOTE — ED Notes (Signed)
Made 1st request for urine sample,patient states he is unable to provide one at this time.
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
## 2016-08-09 ENCOUNTER — Inpatient Hospital Stay (HOSPITAL_COMMUNITY): Admission: AD | Admit: 2016-08-09 | Payer: Federal, State, Local not specified - Other | Admitting: Psychiatry

## 2016-08-09 DIAGNOSIS — Z79899 Other long term (current) drug therapy: Secondary | ICD-10-CM

## 2016-08-09 DIAGNOSIS — Z880 Allergy status to penicillin: Secondary | ICD-10-CM | POA: Diagnosis not present

## 2016-08-09 DIAGNOSIS — R4589 Other symptoms and signs involving emotional state: Secondary | ICD-10-CM

## 2016-08-09 DIAGNOSIS — F17210 Nicotine dependence, cigarettes, uncomplicated: Secondary | ICD-10-CM | POA: Diagnosis not present

## 2016-08-09 DIAGNOSIS — F250 Schizoaffective disorder, bipolar type: Secondary | ICD-10-CM

## 2016-08-09 DIAGNOSIS — R4689 Other symptoms and signs involving appearance and behavior: Secondary | ICD-10-CM | POA: Insufficient documentation

## 2016-08-09 MED ORDER — MIRTAZAPINE 30 MG PO TABS
15.0000 mg | ORAL_TABLET | Freq: Every day | ORAL | Status: DC
  Administered 2016-08-09 – 2016-08-10 (×2): 15 mg via ORAL
  Filled 2016-08-09 (×3): qty 1

## 2016-08-09 MED ORDER — ASPIRIN 81 MG PO CHEW
81.0000 mg | CHEWABLE_TABLET | Freq: Every day | ORAL | Status: DC
  Administered 2016-08-11 – 2016-08-12 (×2): 81 mg via ORAL
  Filled 2016-08-09 (×3): qty 1

## 2016-08-09 MED ORDER — ZIPRASIDONE MESYLATE 20 MG IM SOLR: 20.0000 mg | Freq: Once | INTRAMUSCULAR | Status: DC

## 2016-08-09 MED ORDER — DIPHENHYDRAMINE HCL 50 MG/ML IJ SOLN: 50.0000 mg | Freq: Once | INTRAMUSCULAR | Status: DC

## 2016-08-09 MED ORDER — DIPHENHYDRAMINE HCL 50 MG/ML IJ SOLN: 50.0000 mg | Freq: Once | INTRAMUSCULAR | Status: DC | PRN

## 2016-08-09 MED ORDER — LORAZEPAM 2 MG/ML IJ SOLN: 2.0000 mg | Freq: Once | INTRAMUSCULAR | Status: DC

## 2016-08-09 MED ORDER — ASENAPINE MALEATE 5 MG SL SUBL
10.0000 mg | SUBLINGUAL_TABLET | Freq: Two times a day (BID) | SUBLINGUAL | Status: DC
  Administered 2016-08-10 – 2016-08-12 (×5): 10 mg via SUBLINGUAL
  Filled 2016-08-09 (×7): qty 2

## 2016-08-09 MED ORDER — LORAZEPAM 2 MG/ML IJ SOLN: 2.0000 mg | Freq: Once | INTRAMUSCULAR | Status: DC | PRN

## 2016-08-09 MED ORDER — ZIPRASIDONE MESYLATE 20 MG IM SOLR: 20.0000 mg | Freq: Once | INTRAMUSCULAR | Status: DC | PRN
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
## 2016-08-09 NOTE — Progress Notes (Signed)
08/09/16 1408:  LRT offered pt activities, pt refused.  Caroll RancherMarjette Oree Mirelez, LRT/CTRS

## 2016-08-09 NOTE — ED Notes (Signed)
Bed: [id number] Expected date:  Expected time:  Means of arrival:  Comments: Hold for hall C

## 2016-08-09 NOTE — ED Notes (Signed)
Pt up at nurses station speech loud, irritable. Pt requesting to speak to off duty [employer]. [employer] present. Pt demanding officer to take him outside to smoke. Pt counseled that this was not allowed. Pt went back to his room. Will continue to monitor.

## 2016-08-09 NOTE — ED Notes (Signed)
Informed by Miki KinsLinsey AC that [facility] d/t refusing medication at this time. NP made aware.
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
## 2016-08-09 NOTE — ED Notes (Signed)
Pt refusing prescribed medication, pt reports he has been here for 3 days straight and has already taken his medication.

## 2016-08-09 NOTE — BH Assessment (Addendum)
[facility] Assessment Progress Note  Per Thedore MinsMojeed Akintayo, MD, this pt requires psychiatric hospitalization at this time.  The following facilities have been contacted to seek placement for this pt, with results as noted:  Beds available, information sent, decision pending:  [facility]   [facility]  [facility]   Saranne Crislip, [place] Triage Specialist [phone]

## 2016-08-09 NOTE — ED Notes (Signed)
Patient denies SI, HI and AVH at this time. Patient appears irritable at this time. Plan of care discussed and patient voices no complaints or concerns at this time. Encouragement and support provided and safety maintain. Q 15 min safety checks remain in place and video monitoring.

## 2016-08-09 NOTE — ED Notes (Signed)
Jorene MinorsJameson Lord,NP notified of pt refusing medication.
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
## 2016-08-10 MED ORDER — MIRTAZAPINE 30 MG PO TABS
15.0000 mg | ORAL_TABLET | Freq: Every day | ORAL | Status: DC
  Administered 2016-08-10 – 2016-08-11 (×2): 15 mg via ORAL
  Filled 2016-08-10 (×2): qty 1

## 2016-08-10 NOTE — Progress Notes (Signed)
08/10/16 1345:  Pt was sleep.   Caroll RancherMarjette Avon Molock, LRT/CTRS

## 2016-08-10 NOTE — ED Notes (Signed)
Patient denies SI, HI and AVH at this time. Plan of care discussed and patient voices no complaints or concerns at this time. Encouragement and support provided and safety maintain. Q 15 min safety checks remain in place and video monitoring.
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
## 2016-08-10 NOTE — ED Notes (Signed)
Pt talking on hallway phone. Special checks q 15 mins in place for safety.Video monitoring in place.

## 2016-08-10 NOTE — ED Notes (Signed)
Pt initially refusing morning medication regimen, pt then reports he will take his morning medication, but not the aspirin.

## 2016-08-10 NOTE — BH Assessment (Signed)
[facility] Assessment Progress Note   This clinician contacted pending facilities. [facility]. [facility] is at capacity. Left a message with [facility]ica at [facility]. [facility]

## 2016-08-11 MED ORDER — NAPHAZOLINE-GLYCERIN 0.012-0.2 % OP SOLN
1.0000 [drp] | Freq: Four times a day (QID) | OPHTHALMIC | Status: DC | PRN
  Administered 2016-08-11: 1 [drp] via OPHTHALMIC
  Filled 2016-08-11: qty 15
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
## 2016-08-11 NOTE — BH Assessment (Signed)
[facility] Assessment Progress Note  Per Thedore MinsMojeed Akintayo, MD, this pt continues to require psychiatric hospitalization at this time.  The following facilities have been contacted to seek placement for this pt, with results as noted:  Beds available, information sent, decision pending:  [facility]   Declined:  [facility] (due to medical acuity)   At capacity:  [facility] (no high acuity beds) [facility] (no male beds) [facility]   Haroun Cotham, [place] Triage Specialist [phone]

## 2016-08-11 NOTE — ED Notes (Signed)
Patient euphoric, elated most of day.  Pleasant.  Denies SI, HI, and AVH.  Patient very talkative.   Speech sometimes tangential, flight of ideas.  Usually calm but sometimes pressured speech.   Per NP patient notified if evening goes well he may discharge tomorrow.  Patient got agitated and started pointing at nurse, [facility] and was upset he had to stay another night.  He eventually calmed down and was appropriate afterwards.  Taking medicines as ordered.

## 2016-08-11 NOTE — Progress Notes (Signed)
08/11/16 1339:  Pt was sleep.   Caroll RancherMarjette Kendryck Lacroix, LRT/CTRS

## 2016-08-11 NOTE — ED Notes (Signed)
SBAR Report received from previous nurse. Pt received calm and visible on unit. Pt denies current SI/ HI, A/V H, depression, anxiety, and pain at this time, and is otherwise stable. Pt reminded of camera surveillance, q 15 min rounds, and rules of the milieu. Pt screened for contraband by writer, will continue to assess. 

## 2016-08-12 DIAGNOSIS — Z880 Allergy status to penicillin: Secondary | ICD-10-CM | POA: Diagnosis not present

## 2016-08-12 DIAGNOSIS — F250 Schizoaffective disorder, bipolar type: Secondary | ICD-10-CM | POA: Diagnosis not present

## 2016-08-12 DIAGNOSIS — F17210 Nicotine dependence, cigarettes, uncomplicated: Secondary | ICD-10-CM | POA: Diagnosis not present

## 2016-08-12 DIAGNOSIS — R4589 Other symptoms and signs involving emotional state: Secondary | ICD-10-CM | POA: Diagnosis not present

## 2016-08-12 MED ORDER — ASENAPINE MALEATE 5 MG SL SUBL: 10.0000 mg | SUBLINGUAL_TABLET | Freq: Two times a day (BID) | SUBLINGUAL | 0 refills | Status: DC

## 2016-08-12 MED ORDER — MIRTAZAPINE 15 MG PO TABS: 15.0000 mg | ORAL_TABLET | Freq: Every day | ORAL | 0 refills | Status: DC
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
## 2016-08-12 NOTE — ED Notes (Signed)
Pt up asking for equipment to shower. Pt was directed to wait till morning, and accepted re-dorection well.. Pt speaking out loudly perhaps responding, perhaps rapping.

## 2016-08-12 NOTE — ED Notes (Signed)
Pt discharged home. Discharged instructions read to pt who verbalized understanding. All belongings returned to pt who signed for same. Denies SI/HI, is not delusional and not responding to internal stimuli. Escorted pt to the ED exit.    

## 2016-08-12 NOTE — Discharge Instructions (Signed)
Schizoaffective Disorder  Schizoaffective disorder (ScAD) is a mental illness. It causes symptoms that are a mixture of schizophrenia (a psychotic disorder) and an affective (mood) disorder. The schizophrenic symptoms may include delusions, hallucinations, or odd behavior. The mood symptoms may be similar to major depression or bipolar disorder. ScAD may interfere with personal relationships or normal daily activities. People with ScAD are at increased risk for job loss, social isolation, physical health problems, anxiety and substance use disorders, and suicide.  ScAD usually occurs in cycles. Periods of severe symptoms are followed by periods of  less severe symptoms or improvement. The illness affects men and women equally but usually appears at an earlier age (teenage or early adult years) in men. People who have family members with schizophrenia, bipolar disorder, or ScAD are at higher risk of developing ScAD.  SYMPTOMS   At any one time, people with ScAD may have psychotic symptoms only or both psychotic and mood symptoms. The psychotic symptoms include one or more of the following:  · Hearing, seeing, or feeling things that are not there (hallucinations).    · Having fixed, false beliefs (delusions). The delusions usually are of being attacked, harassed, cheated, persecuted, or conspired against (paranoid delusions).  · Speaking in a way that makes no sense to others (disorganized speech).  The psychotic symptoms of ScAD may also include confusing or odd behavior or any of the negative symptoms of schizophrenia. These include loss of motivation for normal daily activities, such as bathing or grooming, withdrawal from other people, and lack of emotions.     The mood symptoms of ScAD occur more often than not. They resemble major depressive disorder or bipolar mania. Symptoms of major depression include depressed mood and four or more of the following:  · Loss of interest in usually pleasurable activities  (anhedonia).  · Sleeping more or less than normal.  · Feeling worthless or excessively guilty.  · Lack of energy or motivation.  · Trouble concentrating.  · Eating more or less than usual.  · Thinking a lot about death or suicide.  Symptoms of bipolar mania include abnormally elevated or irritable mood and increased energy or activity, plus three or more of the following:    · More confidence than normal or feeling that you are able to do anything (grandiosity).  · Feeling rested with less sleep than normal.    · Being easily distracted.    · Talking more than usual or feeling pressured to keep talking.    · Feeling that your thoughts are racing.  · Engaging in high-risk activities such as buying sprees or foolish business decisions.  DIAGNOSIS   ScAD is diagnosed through an assessment by your health care provider. Your health care provider will observe and ask questions about your thoughts, behavior, mood, and ability to function in daily life. Your health care provider may also ask questions about your medical history and use of drugs, including prescription medicines. Your health care provider may also order blood tests and imaging exams. Certain medical conditions and substances can cause symptoms that resemble ScAD. Your health care provider may refer you to a mental health specialist for evaluation.   ScAD is divided into two types. The depressive type is diagnosed if your mood symptoms are limited to major depression. The bipolar type is diagnosed if your mood symptoms are manic or a mixture of manic and depressive symptoms  TREATMENT   ScAD is usually a lifelong illness. Long-term treatment is necessary. The following treatments are available:  · Medicine. Different types of   medicine are used to treat ScAD. The exact combination depends on the type and severity of your symptoms. Antipsychotic medicine is used to control psychotic symptoms such as delusions, paranoia, and hallucinations. Mood stabilizers can  even the highs and lows of bipolar manic mood swings. Antidepressant medicines are used to treat major depressive symptoms.  · Counseling or talk therapy. Individual, group, or family counseling may be helpful in providing education, support, and guidance. Many people with ScAD also benefit from social skills and job skills (vocational) training.  A combination of medicine and counseling is usually best for managing the disorder over time. A procedure in which electricity is applied to the brain through the scalp (electroconvulsive therapy) may be used to treat people with severe manic symptoms that do not respond to medicine and counseling.  HOME CARE INSTRUCTIONS   · Take all your medicine as prescribed.  · Check with your health care provider before starting new prescription or over-the-counter medicines.  · Keep all follow up appointments with your health care provider.  SEEK MEDICAL CARE IF:   · If you are not able to take your medicines as prescribed.  · If your symptoms get worse.  SEEK IMMEDIATE MEDICAL CARE IF:   · You have serious thoughts about hurting yourself or others.     This information is not intended to replace advice given to you by your health care provider. Make sure you discuss any questions you have with your health care provider.     Document Released: 02/19/2007 Document Revised: 10/30/2014 Document Reviewed: 05/23/2013  Elsevier Interactive Patient Education ©2016 [employer]

## 2016-08-12 NOTE — [facility] Suicide Risk Assessment (Signed)
Suicide Risk Assessment  Discharge Assessment   [facility]   Principal Problem: Schizoaffective disorder, bipolar type [facility](HCC) Discharge Diagnoses:  Patient Active Problem List   Diagnosis Date Noted  . Schizoaffective disorder, bipolar type (HCC) [F25.0] 03/04/2016    Priority: High  . Aggressive behavior [R45.89]   . Obesity [E66.9] 02/20/2013  . STEMI (ST elevation myocardial infarction) (HCC) [I21.3] 02/20/2013  . Myocardial contusion [S26.91XA] 02/19/2013  . Tobacco use [Z72.0] 02/19/2013  . Marijuana use [F12.90] 02/19/2013  . History of bipolar disorder [Z86.59] 02/19/2013  . Elevated troponin [R74.8] 02/18/2013    Total Time spent with patient: 45 minutes  Musculoskeletal: Strength & Muscle Tone: within normal limits Gait & Station: normal Patient leans: N/A  Psychiatric Specialty Exam: Physical Exam  Constitutional: He is oriented to person, place, and time. He appears well-developed and well-nourished.  HENT:  Head: Normocephalic.  Neck: Normal range of motion.  Respiratory: Effort normal.  Musculoskeletal: Normal range of motion.  Neurological: He is alert and oriented to person, place, and time.  Skin: Skin is warm and dry.  Psychiatric: He has a normal mood and affect. His speech is normal and behavior is normal. Thought content normal. Cognition and memory are normal. He expresses impulsivity.    Review of Systems  Constitutional: Negative.   HENT: Negative.   Eyes: Negative.   Respiratory: Negative.   Cardiovascular: Negative.   Gastrointestinal: Negative.   Genitourinary: Negative.   Musculoskeletal: Negative.   Skin: Negative.   Neurological: Negative.   Endo/Heme/Allergies: Negative.   Psychiatric/Behavioral: Negative.     Blood pressure 173/96, pulse 73, temperature 97.7 F (36.5 C), temperature source Oral, resp. rate 17, SpO2 97 %.There is no height or weight on file to calculate BMI.  General Appearance: Casual   Eye Contact:  Normal  Speech: Normal  Volume:  Normal   Mood:  Euthymic  Affect:  Blunt  Thought Process:  Normal  Orientation:  Full (Time, Place, and Person)  Thought Content:  Normal  Suicidal Thoughts:  No  Homicidal Thoughts:  No  Memory:  Immediate, good; Recent, good; Remote, good  Judgement:  Fair  Insight:  Fair  Psychomotor Activity:  Normal   Concentration:  Concentration, fair; attention span, fair  Recall:  Good  Fund of Knowledge:  Fair  Language:  Good  Akathisia:  No  Handed:  Right  AIMS (if indicated):     Assets:  Housing Leisure Time Physical Health Resilience Social Support  ADL's:  Intact  Cognition:  Normal  Sleep:       Mental Status Per Nursing Assessment::   On Admission:   mania  Demographic Factors:  Male and Adolescent or young adult  Loss Factors: NA  Historical Factors: Impulsivity  Risk Reduction Factors:   Sense of responsibility to family, Living with another person, especially a relative, Positive social support and Positive therapeutic relationship  Continued Clinical Symptoms:  None  Cognitive Features That Contribute To Risk:  None    Suicide Risk:  Minimal: No identifiable suicidal ideation.  Patients presenting with no risk factors but with morbid ruminations; may be classified as minimal risk based on the severity of the depressive symptoms    Plan Of Care/Follow-up recommendations:  Activity:  as tolerated Diet:  heart healthy diet  Brytni Dray, NP 08/12/2016, 7:21 AM

## 2016-09-09 ENCOUNTER — Encounter (HOSPITAL_COMMUNITY): Payer: Self-pay | Admitting: Emergency Medicine

## 2016-09-09 ENCOUNTER — Emergency Department (HOSPITAL_COMMUNITY)
Admission: EM | Admit: 2016-09-09 | Discharge: 2016-09-13 | Disposition: A | Discharge status: Home / Self Care | Payer: Federal, State, Local not specified - Other | Attending: Emergency Medicine | Admitting: Emergency Medicine

## 2016-09-09 DIAGNOSIS — F250 Schizoaffective disorder, bipolar type: Secondary | ICD-10-CM

## 2016-09-09 DIAGNOSIS — Z7982 Long term (current) use of aspirin: Secondary | ICD-10-CM | POA: Insufficient documentation

## 2016-09-09 DIAGNOSIS — F259 Schizoaffective disorder, unspecified: Secondary | ICD-10-CM | POA: Insufficient documentation

## 2016-09-09 DIAGNOSIS — F17210 Nicotine dependence, cigarettes, uncomplicated: Secondary | ICD-10-CM | POA: Insufficient documentation

## 2016-09-09 DIAGNOSIS — Z79899 Other long term (current) drug therapy: Secondary | ICD-10-CM | POA: Insufficient documentation

## 2016-09-09 LAB — COMPREHENSIVE METABOLIC PANEL
ALT: 35 U/L (ref 17–63)
AST: 23 U/L (ref 15–41)
Albumin: 3.9 g/dL (ref 3.5–5.0)
Alkaline Phosphatase: 44 U/L (ref 38–126)
Anion gap: 8 (ref 5–15)
BILIRUBIN TOTAL: 0.6 mg/dL (ref 0.3–1.2)
BUN: 8 mg/dL (ref 6–20)
CHLORIDE: 107 mmol/L (ref 101–111)
CO2: 25 mmol/L (ref 22–32)
CREATININE: 1 mg/dL (ref 0.61–1.24)
Calcium: 8.6 mg/dL — ABNORMAL LOW (ref 8.9–10.3)
GFR calc Af Amer: >60 mL/min (ref >60)
Glucose, Bld: 122 mg/dL — ABNORMAL HIGH (ref 65–99)
Potassium: 3.6 mmol/L (ref 3.5–5.1)
Sodium: 140 mmol/L (ref 135–145)
Total Protein: 6.8 g/dL (ref 6.5–8.1)

## 2016-09-09 LAB — RAPID URINE DRUG SCREEN, HOSP PERFORMED
AMPHETAMINES: NOT DETECTED
Barbiturates: NOT DETECTED
Benzodiazepines: NOT DETECTED
Cocaine: NOT DETECTED
Opiates: NOT DETECTED
Tetrahydrocannabinol: POSITIVE — AB

## 2016-09-09 LAB — CBC
HEMATOCRIT: 42 % (ref 39.0–52.0)
Hemoglobin: 13.7 g/dL (ref 13.0–17.0)
MCH: 27.7 pg (ref 26.0–34.0)
MCHC: 32.6 g/dL (ref 30.0–36.0)
MCV: 84.8 fL (ref 78.0–100.0)
PLATELETS: 258 10*3/uL (ref 150–400)
RBC: 4.95 MIL/uL (ref 4.22–5.81)
RDW: 14.2 % (ref 11.5–15.5)
WBC: 7.7 10*3/uL (ref 4.0–10.5)

## 2016-09-09 LAB — ETHANOL

## 2016-09-09 MED ORDER — STERILE WATER FOR INJECTION IJ SOLN
INTRAMUSCULAR | Status: AC
  Administered 2016-09-09: 2 mL
  Filled 2016-09-09: qty 10

## 2016-09-09 MED ORDER — ASENAPINE MALEATE 5 MG SL SUBL
10.0000 mg | SUBLINGUAL_TABLET | Freq: Two times a day (BID) | SUBLINGUAL | Status: DC
  Administered 2016-09-09 – 2016-09-13 (×9): 10 mg via SUBLINGUAL
  Filled 2016-09-09 (×9): qty 2

## 2016-09-09 MED ORDER — MIRTAZAPINE 30 MG PO TABS
15.0000 mg | ORAL_TABLET | Freq: Every day | ORAL | Status: DC
  Administered 2016-09-09 – 2016-09-12 (×4): 15 mg via ORAL
  Filled 2016-09-09 (×4): qty 1

## 2016-09-09 MED ORDER — ZIPRASIDONE MESYLATE 20 MG IM SOLR
10.0000 mg | Freq: Once | INTRAMUSCULAR | Status: AC
  Administered 2016-09-09: 10 mg via INTRAMUSCULAR
  Filled 2016-09-09: qty 20
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
## 2016-09-09 NOTE — ED Notes (Signed)
TTS consult in process. 

## 2016-09-09 NOTE — ED Notes (Signed)
Bed: [id number] Expected date:  Expected time:  Means of arrival:  Comments: Hold for 28 
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
## 2016-09-09 NOTE — BH Assessment (Addendum)
Attempted to assess patient. Patient started the assessment but was unable to finish. Patient dozed off several times during the assessment. At first patient responded to his name being called then patient was unable to answer questions due to being sedated.   Patient stated that he was not suicidal and he has never attempted to harm himself. Patient was unable to answer the remaining questions.   Davina PokeJoVea Kardell Virgil, LCSW Therapeutic Triage Specialist [facility] 09/09/2016 4:49 PM

## 2016-09-09 NOTE — ED Notes (Signed)
Patient is calm and cooperative at this time. Patient denies SI, HI and AVH at this time. Plan of care discussed with patient. Patient voices no complaints or concerns at this time. Encouragement and support provided and safety maintain. Q 15 min safety checks in place and video monitoring.

## 2016-09-09 NOTE — ED Notes (Signed)
Patient belongings (3 bags) placed in locker #28.

## 2016-09-09 NOTE — ED Notes (Signed)
Safety check for contraband performed and none found. 
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
## 2016-09-10 DIAGNOSIS — Z9889 Other specified postprocedural states: Secondary | ICD-10-CM

## 2016-09-10 DIAGNOSIS — Z880 Allergy status to penicillin: Secondary | ICD-10-CM | POA: Diagnosis not present

## 2016-09-10 DIAGNOSIS — F17210 Nicotine dependence, cigarettes, uncomplicated: Secondary | ICD-10-CM | POA: Diagnosis not present

## 2016-09-10 DIAGNOSIS — F250 Schizoaffective disorder, bipolar type: Secondary | ICD-10-CM | POA: Diagnosis not present

## 2016-09-10 MED ORDER — METOPROLOL SUCCINATE ER 50 MG PO TB24
50.0000 mg | ORAL_TABLET | Freq: Every day | ORAL | Status: DC
  Administered 2016-09-10 – 2016-09-13 (×4): 50 mg via ORAL
  Filled 2016-09-10 (×5): qty 1

## 2016-09-10 MED ORDER — ASPIRIN 81 MG PO CHEW
81.0000 mg | CHEWABLE_TABLET | Freq: Every day | ORAL | Status: DC
  Administered 2016-09-10 – 2016-09-13 (×4): 81 mg via ORAL
  Filled 2016-09-10 (×4): qty 1

## 2016-09-10 MED ORDER — ASENAPINE MALEATE 5 MG SL SUBL: 10.0000 mg | SUBLINGUAL_TABLET | Freq: Three times a day (TID) | SUBLINGUAL | Status: DC | PRN

## 2016-09-10 NOTE — BH Assessment (Signed)
Faxed information to the following facilities for placement:  [facility]   [address] Patsy BaltimoreWarrick Jr, [facility], [facility], [facility] Triage Specialist [phone]

## 2016-09-10 NOTE — ED Notes (Signed)
Pt is not manic, but he is irritable.
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
## 2016-09-10 NOTE — ED Notes (Signed)
Pt has been awake again and manic again.

## 2016-09-10 NOTE — ED Notes (Signed)
Introduced self to patient/family. Pt oriented to unit expectations.  Assessed pt for:  A) Anxiety &/or agitation: Pt very agitated this morning. He is loud, aminated,  with rapid speech and is irritable. He is hyper-verbal with other patients. The male in the room  across the hall from him was moved because they are both manic and stimulating each other too much.   S) Safety: Safety maintained with q-15-minute checks and hourly rounds by staff.   A) ADLs: Pt able to do his own ADLs without assist.  P) Pick-Up (room cleanliness): Pt's room being kept free from clutter. Food trays removed, etc.
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
## 2016-09-11 ENCOUNTER — Encounter (HOSPITAL_COMMUNITY): Payer: Self-pay | Admitting: Emergency Medicine

## 2016-09-11 MED ORDER — ASENAPINE MALEATE 5 MG SL SUBL
10.0000 mg | SUBLINGUAL_TABLET | Freq: Three times a day (TID) | SUBLINGUAL | Status: DC | PRN
  Administered 2016-09-11: 10 mg via SUBLINGUAL
  Filled 2016-09-11: qty 2

## 2016-09-11 MED ORDER — LORAZEPAM 2 MG/ML IJ SOLN
2.0000 mg | Freq: Once | INTRAMUSCULAR | Status: AC
  Administered 2016-09-11: 2 mg via INTRAMUSCULAR
  Filled 2016-09-11: qty 1

## 2016-09-11 MED ORDER — DIPHENHYDRAMINE HCL 50 MG/ML IJ SOLN
50.0000 mg | Freq: Once | INTRAMUSCULAR | Status: AC
  Administered 2016-09-11: 50 mg via INTRAMUSCULAR
  Filled 2016-09-11: qty 1

## 2016-09-11 NOTE — Progress Notes (Signed)
D Pt. Has been on the phone or being disruptive standing in front of the nurses station this pm. Pt. Denies SI and HI, no complaints of pain or discomfort noted at present time.  A Writer offered support and encouragement, Writer instructed the pt. That he had used his phone privileges for the day.  Pt. Stated that the phone was in use until 9pm.  Writer informed the pt. That was correct but he had been on it constantly throughout the day, telling his peers they had to hurry so he could get back on it.  Writer also informed the pt. That we had complaints from people he had been calling.  R Pt. Became agitated with Clinical research associatewriter but eventually calmed and said OK.  Pt. Did continue to run out and attempt to use the phone but it was cut of.  Pt. Accepted his HS medications and is presently resting.

## 2016-09-11 NOTE — ED Notes (Addendum)
Patient very loud, yelling, threatening, posturing, and demanding to be moved out of here.  [facility] was able to de-escalate him somewhat, but he remained agitated and tearful.  He was offered a PRN dose of Saphris to decrease his agitation level.  He initially refused and asked me to give him a shot, but when I went back to talk with him he changed his mind.  He took 10 mg and has calmed down some.  He did apologize for his previous behavior and is appropriate at this time.   What set him off originally was that he was being loud and disrespectful on the phone so it was cut off.  The phone remains off per nursing discretion.

## 2016-09-11 NOTE — Progress Notes (Signed)
Date: 09/11/16 Time: 1345 Location: [facility] Hallway  Group Topic: Wellness  Goal Area(s) Addresses:  Patient will define components of whole wellness. Patient will verbalize benefit of whole wellness.  Behavioral Response:  Engaged   Intervention: [employer]HealthBeach ball  Activity: Keep Going It Costco WholesaleVolleyball.  Patients stood around in a circle and passed the beach ball back and forth to each other.  The ball could bounce off the floor but could not roll to a stop.    Education: Wellness, Building control surveyorDischarge Planning.   Education Outcome: Acknowledges education/In group clarification offered/Needs additional education.   Clinical Observations/Feedback: Pt was active and engaged.  Pt was social with peers and LRT.  Pt was bright and pleasant throughout group.   Caroll RancherMarjette Altus Zaino, LRT/CTRS

## 2016-09-11 NOTE — ED Triage Notes (Signed)
Pt have been causing an uproar on the unit as evidence by rapping inappropriate  lyrics disturbing other patients, getting other pts rowdy and upset and using profanity towards staff. Pt have been redirected numerous times but gets louder when told what to do.

## 2016-09-11 NOTE — BH Assessment (Signed)
[facility] Assessment Progress Note  Per Mojeed Akintayo, MD, this pt requires psychiatric hospitalization at this time.  The following facilities have been contacted to seek placement for this pt, with results as noted:  Beds available, information sent, decision pending:  [facility]   At capacity:  [facility]   Anelisse Jacobson, MA Triage Specialist [phone]     

## 2016-09-11 NOTE — Progress Notes (Signed)
entered in d/c instructions  Please use the resources provided to you in emergency room by case manager to assist you're your choice of doctor for follow up  These [place] uninsured resources provide possible primary care providers, resources for discounted medications, housing, dental resources, affordable care act information, plus other resources for [place]

## 2016-09-11 NOTE — ED Notes (Signed)
Patient at nurses yelling screaming. Special educational needs teachertaff and writer unable to redirect patient at this time. Patient will be medicated per MAR. Encouragement and support provided and safety maintain. Q  15 min safety checks remain in place and video monitoring.

## 2016-09-11 NOTE — ED Notes (Signed)
Patient denies SI, HI and AVH at this time. Patient has loud, rapid pressured speech at this time. Plan of care discussed with patient. Encouragement and support provided and safety maintain. Q 15 min safety checks remain in place and video monitoring.

## 2016-09-11 NOTE — BH Assessment (Signed)
[facility] Assessment Progress Note Patient is very loud and screams at this Clinical research associatewriter. This writer attempted to redirect patient unsuccessfully. Patient will not return to his room to allow this writer to speak to him. Patient is singing in the hall as he walks away from this Clinical research associatewriter. Per Jannifer FranklinAkintayo MD patient continues to meet inpatient criteria.

## 2016-09-12 ENCOUNTER — Encounter (HOSPITAL_COMMUNITY): Payer: Self-pay | Admitting: Emergency Medicine

## 2016-09-12 DIAGNOSIS — F17210 Nicotine dependence, cigarettes, uncomplicated: Secondary | ICD-10-CM | POA: Diagnosis not present

## 2016-09-12 DIAGNOSIS — F250 Schizoaffective disorder, bipolar type: Secondary | ICD-10-CM

## 2016-09-12 DIAGNOSIS — Z79899 Other long term (current) drug therapy: Secondary | ICD-10-CM

## 2016-09-12 DIAGNOSIS — Z880 Allergy status to penicillin: Secondary | ICD-10-CM

## 2016-09-12 DIAGNOSIS — Z9889 Other specified postprocedural states: Secondary | ICD-10-CM | POA: Diagnosis not present

## 2016-09-12 MED ORDER — ZIPRASIDONE MESYLATE 20 MG IM SOLR
INTRAMUSCULAR | Status: AC
  Filled 2016-09-12: qty 20

## 2016-09-12 MED ORDER — STERILE WATER FOR INJECTION IJ SOLN
INTRAMUSCULAR | Status: AC
  Filled 2016-09-12: qty 10

## 2016-09-12 MED ORDER — ZIPRASIDONE MESYLATE 20 MG IM SOLR
20.0000 mg | Freq: Once | INTRAMUSCULAR | Status: AC
  Administered 2016-09-12: 20 mg via INTRAMUSCULAR

## 2016-09-12 MED ORDER — DIPHENHYDRAMINE HCL 50 MG/ML IJ SOLN
50.0000 mg | Freq: Once | INTRAMUSCULAR | Status: AC
  Administered 2016-09-12: 50 mg via INTRAMUSCULAR
  Filled 2016-09-12: qty 1

## 2016-09-12 MED ORDER — LORAZEPAM 2 MG/ML IJ SOLN
2.0000 mg | Freq: Once | INTRAMUSCULAR | Status: AC
  Administered 2016-09-12: 2 mg via INTRAMUSCULAR
  Filled 2016-09-12: qty 1

## 2016-09-12 NOTE — ED Notes (Signed)
Pt behavior disruptive. Pt loud, argumentative, threatening, behavior difficult to redirect. MD made aware.
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
## 2016-09-12 NOTE — ED Notes (Signed)
Pt talking on hallway phone.  

## 2016-09-12 NOTE — Progress Notes (Signed)
09/12/16 1356:  Pt was sleep.  Caroll RancherMarjette Evy Lutterman, LRT/CTRS

## 2016-09-12 NOTE — ED Notes (Signed)
Pt observed sitting in room in chair eating lunch. Pt voices no complaints at this time. Special checks q 15 mins in place for safety. Video monitoring in place.

## 2016-09-12 NOTE — ED Notes (Signed)
Patient denies SI, HI and AVH at this time. Patient is calm and cooperative at this time. Plan of care discussed with patient. Patient voices no complaints or concerns at this time. Encouragement and support provided and safety maintain. Q 15 min safety checks in place and video monitoring.

## 2016-09-13 MED ORDER — ASENAPINE MALEATE 5 MG SL SUBL: 10.0000 mg | SUBLINGUAL_TABLET | Freq: Two times a day (BID) | SUBLINGUAL | 0 refills | Status: DC

## 2016-09-13 MED ORDER — MIRTAZAPINE 15 MG PO TABS: 15.0000 mg | ORAL_TABLET | Freq: Every day | ORAL | 0 refills | Status: DC

## 2016-09-13 MED ORDER — METOPROLOL SUCCINATE ER 25 MG PO TB24: 50.0000 mg | ORAL_TABLET | Freq: Every day | ORAL | 0 refills | Status: DC

## 2016-09-13 NOTE — ED Notes (Signed)
Pt d/c from the hospital. All items returned. D/C instructions given and prescriptions given. Pt denies si and hi. 

## 2016-09-13 NOTE — ED Notes (Signed)
Pt is anxious and restless on the unit and waiting to be discharged. He denies si and hi thoughts. Pt has been on the phone this morning and tangential in speech.
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
## 2016-09-13 NOTE — BH Assessment (Signed)
[facility] Assessment Progress Note  Per Thedore MinsMojeed Akintayo, MD, this pt does not require psychiatric hospitalization at this time.  Pt presents under IVC initiated by his mother, which Dr Jannifer FranklinAkintayo has rescinded.  [facility]  This has been included in pt's discharge instructions.  Pt's nurse, Jan, has been notified.  Doylene Canninghomas Liane Tribbey, MA Triage Specialist [phone]

## 2016-09-13 NOTE — Discharge Instructions (Signed)
For your ongoing mental health needs, you are advised to follow up with [name].  If you do not currently have an appointment, new and returning patients are seen at their walk-in clinic.  Walk-in hours are Monday - Friday from 8:00 am - 3:00 pm.  Walk-in patients are seen on a first come, first served basis.  Try to arrive as early as possible for he best chance of being seen the same day: ° °     [name] °     [address] °     [place], [place] [address] °     [phone] °

## 2016-09-13 NOTE — [facility] Suicide Risk Assessment (Signed)
Suicide Risk Assessment  Discharge Assessment   [facility]   Principal Problem: Schizoaffective disorder, bipolar type [facility](HCC) Discharge Diagnoses:  Patient Active Problem List   Diagnosis Date Noted  . Schizoaffective disorder, bipolar type (HCC) [F25.0] 03/04/2016    Priority: High  . Aggressive behavior [R45.89]   . Obesity [E66.9] 02/20/2013  . STEMI (ST elevation myocardial infarction) (HCC) [I21.3] 02/20/2013  . Myocardial contusion [S26.91XA] 02/19/2013  . Tobacco use [Z72.0] 02/19/2013  . Marijuana use [F12.90] 02/19/2013  . History of bipolar disorder [Z86.59] 02/19/2013  . Elevated troponin [R74.8] 02/18/2013    Total Time spent with patient: 30 minutes  Musculoskeletal: Strength & Muscle Tone: within normal limits Gait & Station: normal Patient leans: N/A  Psychiatric Specialty Exam: Physical Exam  Constitutional: He is oriented to person, place, and time. He appears well-developed and well-nourished.  HENT:  Head: Normocephalic.  Neck: Normal range of motion.  Respiratory: Effort normal.  Musculoskeletal: Normal range of motion.  Neurological: He is alert and oriented to person, place, and time.  Skin: Skin is warm and dry.  Psychiatric: He has a normal mood and affect. His speech is normal and behavior is normal. Judgment and thought content normal. Cognition and memory are normal.    Review of Systems  Constitutional: Negative.   HENT: Negative.   Eyes: Negative.   Respiratory: Negative.   Cardiovascular: Negative.   Gastrointestinal: Negative.   Genitourinary: Negative.   Musculoskeletal: Negative.   Skin: Negative.   Neurological: Negative.   Endo/Heme/Allergies: Negative.   Psychiatric/Behavioral: Negative.     Blood pressure 112/74, pulse 76, temperature 97.9 F (36.6 C), temperature source Oral, resp. rate 18, SpO2 99 %.There is no height or weight on file to calculate BMI.  General Appearance: Casual  Eye Contact:   Good  Speech:  Normal Rate  Volume:  Normal  Mood:  Euthymic  Affect:  Congruent  Thought Process:  Coherent and Descriptions of Associations: Intact  Orientation:  Full (Time, Place, and Person)  Thought Content:  WDL  Suicidal Thoughts:  No  Homicidal Thoughts:  No  Memory:  Immediate;   Good Recent;   Good Remote;   Good  Judgement:  Fair  Insight:  Fair  Psychomotor Activity:  Normal  Concentration:  Concentration: Good and Attention Span: Good  Recall:  Good  Fund of Knowledge:  Good  Language:  Good  Akathisia:  No  Handed:  Right  AIMS (if indicated):     Assets:  Housing Leisure Time Physical Health Resilience Social Support  ADL's:  Intact  Cognition:  WNL  Sleep:       Mental Status Per Nursing Assessment::   On Admission:   mania   Demographic Factors:  Male and Adolescent or young adult  Loss Factors: NA  Historical Factors: NA  Risk Reduction Factors:   Sense of responsibility to family, Living with another person, especially a relative, Positive social support and Positive therapeutic relationship  Continued Clinical Symptoms:  None  Cognitive Features That Contribute To Risk:  None    Suicide Risk:  Minimal: No identifiable suicidal ideation.  Patients presenting with no risk factors but with morbid ruminations; may be classified as minimal risk based on the severity of the depressive symptoms  Follow-up Information    Please use the resources provided to you in emergency room by case manager to assist you're your choice of doctor for follow up. Schedule an appointment as soon as possible for a visit.  Contact information: These [place] uninsured resources provide possible primary care providers, resources for discounted medications, housing, dental resources, affordable care act information, plus other resources [place]            Plan Of Care/Follow-up recommendations:  Activity:  as tolerated Diet:  heart  healthy diet  Michale Emmerich, NP 09/13/2016, 9:14 AM

## 2016-10-08 ENCOUNTER — Encounter (HOSPITAL_COMMUNITY): Payer: Self-pay | Admitting: Oncology

## 2016-10-08 ENCOUNTER — Emergency Department (HOSPITAL_COMMUNITY)
Admission: EM | Admit: 2016-10-08 | Discharge: 2016-10-09 | Disposition: A | Discharge status: Home / Self Care | Payer: Federal, State, Local not specified - Other | Attending: Emergency Medicine | Admitting: Emergency Medicine

## 2016-10-08 DIAGNOSIS — Z79899 Other long term (current) drug therapy: Secondary | ICD-10-CM | POA: Insufficient documentation

## 2016-10-08 DIAGNOSIS — F17210 Nicotine dependence, cigarettes, uncomplicated: Secondary | ICD-10-CM | POA: Insufficient documentation

## 2016-10-08 DIAGNOSIS — Z7982 Long term (current) use of aspirin: Secondary | ICD-10-CM | POA: Insufficient documentation

## 2016-10-08 DIAGNOSIS — F250 Schizoaffective disorder, bipolar type: Secondary | ICD-10-CM | POA: Diagnosis present

## 2016-10-08 DIAGNOSIS — I252 Old myocardial infarction: Secondary | ICD-10-CM | POA: Insufficient documentation

## 2016-10-08 LAB — CBC
HCT: 44 % (ref 39.0–52.0)
Hemoglobin: 15.3 g/dL (ref 13.0–17.0)
MCH: 28.4 pg (ref 26.0–34.0)
MCHC: 34.8 g/dL (ref 30.0–36.0)
MCV: 81.8 fL (ref 78.0–100.0)
PLATELETS: 289 10*3/uL (ref 150–400)
RBC: 5.38 MIL/uL (ref 4.22–5.81)
RDW: 13.9 % (ref 11.5–15.5)
WBC: 8.8 10*3/uL (ref 4.0–10.5)

## 2016-10-08 LAB — COMPREHENSIVE METABOLIC PANEL
ALT: 41 U/L (ref 17–63)
AST: 31 U/L (ref 15–41)
Albumin: 4.2 g/dL (ref 3.5–5.0)
Alkaline Phosphatase: 43 U/L (ref 38–126)
Anion gap: 8 (ref 5–15)
BILIRUBIN TOTAL: 0.6 mg/dL (ref 0.3–1.2)
BUN: 12 mg/dL (ref 6–20)
CO2: 26 mmol/L (ref 22–32)
Calcium: 9.2 mg/dL (ref 8.9–10.3)
Chloride: 106 mmol/L (ref 101–111)
Creatinine, Ser: 1.03 mg/dL (ref 0.61–1.24)
Glucose, Bld: 133 mg/dL — ABNORMAL HIGH (ref 65–99)
POTASSIUM: 4 mmol/L (ref 3.5–5.1)
Sodium: 140 mmol/L (ref 135–145)
TOTAL PROTEIN: 7.7 g/dL (ref 6.5–8.1)

## 2016-10-08 LAB — ACETAMINOPHEN LEVEL: Acetaminophen (Tylenol), Serum: <10 ug/mL — ABNORMAL LOW (ref 10–30)

## 2016-10-08 LAB — RAPID URINE DRUG SCREEN, HOSP PERFORMED
Amphetamines: NOT DETECTED
BENZODIAZEPINES: NOT DETECTED
Barbiturates: NOT DETECTED
Cocaine: NOT DETECTED
Opiates: NOT DETECTED
Tetrahydrocannabinol: POSITIVE — AB

## 2016-10-08 LAB — SALICYLATE LEVEL

## 2016-10-08 LAB — ETHANOL
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
## 2016-10-08 NOTE — ED Triage Notes (Signed)
Pt is calm and cooperative at this time.  Pt adamantly denies SI/HI/AVH.
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
## 2016-10-08 NOTE — ED Notes (Signed)
Bed: [id number] Expected date:  Expected time:  Means of arrival:  Comments: Hold for JT

## 2016-10-08 NOTE — ED Notes (Signed)
SBAR Report received from previous nurse. Pt received calm and visible on unit. Pt denies current SI/ HI, A/V H, depression, anxiety, or pain at this time, and appears otherwise stable and free of distress. Pt appears very lethargic and only gave one word answers to assessment questions stating he was really tired. Pt reminded of camera surveillance, q 15 min rounds, and rules of the milieu. Will continue to assess.

## 2016-10-09 DIAGNOSIS — F250 Schizoaffective disorder, bipolar type: Secondary | ICD-10-CM | POA: Diagnosis not present

## 2016-10-09 DIAGNOSIS — Z9889 Other specified postprocedural states: Secondary | ICD-10-CM | POA: Diagnosis not present

## 2016-10-09 DIAGNOSIS — F17210 Nicotine dependence, cigarettes, uncomplicated: Secondary | ICD-10-CM

## 2016-10-09 DIAGNOSIS — Z888 Allergy status to other drugs, medicaments and biological substances status: Secondary | ICD-10-CM | POA: Diagnosis not present

## 2016-10-09 DIAGNOSIS — Z79899 Other long term (current) drug therapy: Secondary | ICD-10-CM

## 2016-10-09 MED ORDER — ASENAPINE MALEATE 5 MG SL SUBL
10.0000 mg | SUBLINGUAL_TABLET | Freq: Two times a day (BID) | SUBLINGUAL | Status: DC
  Administered 2016-10-09: 10 mg via SUBLINGUAL
  Filled 2016-10-09: qty 2

## 2016-10-09 MED ORDER — ASPIRIN 81 MG PO CHEW
81.0000 mg | CHEWABLE_TABLET | Freq: Every day | ORAL | Status: DC
  Administered 2016-10-09: 81 mg via ORAL
  Filled 2016-10-09: qty 1

## 2016-10-09 MED ORDER — MIRTAZAPINE 30 MG PO TABS: 15.0000 mg | ORAL_TABLET | Freq: Every day | ORAL | Status: DC

## 2016-10-09 MED ORDER — METOPROLOL SUCCINATE ER 50 MG PO TB24
50.0000 mg | ORAL_TABLET | Freq: Every day | ORAL | Status: DC
  Administered 2016-10-09: 50 mg via ORAL
  Filled 2016-10-09: qty 1

## 2016-10-09 NOTE — [facility] Suicide Risk Assessment (Signed)
Suicide Risk Assessment  Discharge Assessment   [facility]   Principal Problem: Schizoaffective disorder, bipolar type [facility](HCC) Discharge Diagnoses:  Patient Active Problem List   Diagnosis Date Noted  . Schizoaffective disorder, bipolar type (HCC) [F25.0] 03/04/2016    Priority: High  . Aggressive behavior [R45.89]   . Obesity [E66.9] 02/20/2013  . STEMI (ST elevation myocardial infarction) (HCC) [I21.3] 02/20/2013  . Myocardial contusion [S26.91XA] 02/19/2013  . Tobacco use [Z72.0] 02/19/2013  . Marijuana use [F12.90] 02/19/2013  . History of bipolar disorder [Z86.59] 02/19/2013  . Elevated troponin [R74.8] 02/18/2013    Total Time spent with patient: 45 minutes   Musculoskeletal: Strength & Muscle Tone: within normal limits Gait & Station: normal Patient leans: N/A  Psychiatric Specialty Exam: Physical Exam  Constitutional: He is oriented to person, place, and time. He appears well-developed and well-nourished.  HENT:  Head: Normocephalic.  Neck: Normal range of motion.  Respiratory: Effort normal.  Musculoskeletal: Normal range of motion.  Neurological: He is alert and oriented to person, place, and time.  Psychiatric: He has a normal mood and affect. His speech is normal and behavior is normal. Judgment and thought content normal. Cognition and memory are normal.    Review of Systems  All other systems reviewed and are negative.   Blood pressure 150/86, pulse 65, temperature 98.1 F (36.7 C), temperature source Oral, resp. rate 18, height 5\' 7"  (1.702 m), weight 102.1 kg (225 lb), SpO2 94 %.Body mass index is 35.24 kg/m.  General Appearance: Casual  Eye Contact:  Good  Speech:  Normal Rate  Volume:  Normal  Mood:  Irritable, minimal  Affect:  Congruent  Thought Process:  Coherent and Descriptions of Associations: Intact  Orientation:  Full (Time, Place, and Person)  Thought Content:  WDL  Suicidal Thoughts:  No  Homicidal Thoughts:   No  Memory:  Immediate;   Good Recent;   Good Remote;   Good  Judgement:  Fair  Insight:  Fair  Psychomotor Activity:  Normal  Concentration:  Concentration: Good and Attention Span: Good  Recall:  Good  Fund of Knowledge:  Fair  Language:  Good  Akathisia:  No  Handed:  Right  AIMS (if indicated):     Assets:  Leisure Time Physical Health Resilience Social Support  ADL's:  Intact  Cognition:  WNL  Sleep:       Mental Status Per Nursing Assessment::   On Admission:   suicidial ideations  Demographic Factors:  Male and Adolescent or young adult  Loss Factors: NA  Historical Factors: NA  Risk Reduction Factors:   Sense of responsibility to family and Positive social support  Continued Clinical Symptoms:  Irritable at times  Cognitive Features That Contribute To Risk:  None    Suicide Risk:  Minimal: No identifiable suicidal ideation.  Patients presenting with no risk factors but with morbid ruminations; may be classified as minimal risk based on the severity of the depressive symptoms    Plan Of Care/Follow-up recommendations:  Activity:  as tolerated  Diet:  heart healhty diet  Nazyia Gaugh, NP 10/09/2016, 10:48 AM

## 2016-10-09 NOTE — ED Notes (Signed)
Pt discharged home. Discharged instructions read to pt who verbalized understanding. All belongings returned to pt who signed for same. Denies SI/HI, is not delusional and not responding to internal stimuli. Escorted pt to the ED exit.    
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
## 2018-10-26 ENCOUNTER — Other Ambulatory Visit: Payer: Self-pay

## 2018-10-26 ENCOUNTER — Emergency Department (HOSPITAL_COMMUNITY)
Admission: EM | Admit: 2018-10-26 | Discharge: 2018-10-27 | Disposition: A | Discharge status: Home / Self Care | Payer: Medicaid Other | Attending: Emergency Medicine | Admitting: Emergency Medicine

## 2018-10-26 ENCOUNTER — Encounter (HOSPITAL_COMMUNITY): Payer: Self-pay

## 2018-10-26 DIAGNOSIS — I252 Old myocardial infarction: Secondary | ICD-10-CM | POA: Diagnosis not present

## 2018-10-26 DIAGNOSIS — F3110 Bipolar disorder, current episode manic without psychotic features, unspecified: Secondary | ICD-10-CM | POA: Diagnosis not present

## 2018-10-26 DIAGNOSIS — F209 Schizophrenia, unspecified: Secondary | ICD-10-CM | POA: Insufficient documentation

## 2018-10-26 DIAGNOSIS — R945 Abnormal results of liver function studies: Secondary | ICD-10-CM | POA: Diagnosis not present

## 2018-10-26 DIAGNOSIS — F17210 Nicotine dependence, cigarettes, uncomplicated: Secondary | ICD-10-CM | POA: Diagnosis not present

## 2018-10-26 DIAGNOSIS — Z79899 Other long term (current) drug therapy: Secondary | ICD-10-CM | POA: Insufficient documentation

## 2018-10-26 DIAGNOSIS — R7989 Other specified abnormal findings of blood chemistry: Secondary | ICD-10-CM

## 2018-10-26 LAB — RAPID URINE DRUG SCREEN, HOSP PERFORMED
AMPHETAMINES: NOT DETECTED
Barbiturates: NOT DETECTED
Benzodiazepines: NOT DETECTED
Cocaine: NOT DETECTED
OPIATES: NOT DETECTED
Tetrahydrocannabinol: POSITIVE — AB

## 2018-10-26 LAB — COMPREHENSIVE METABOLIC PANEL
ALBUMIN: 3.4 g/dL — AB (ref 3.5–5.0)
ALK PHOS: 40 U/L (ref 38–126)
ALT: 131 U/L — AB (ref 0–44)
ANION GAP: 10 (ref 5–15)
AST: 214 U/L — AB (ref 15–41)
BUN: 10 mg/dL (ref 6–20)
CO2: 24 mmol/L (ref 22–32)
CREATININE: 1.05 mg/dL (ref 0.61–1.24)
Calcium: 8.3 mg/dL — ABNORMAL LOW (ref 8.9–10.3)
Chloride: 100 mmol/L (ref 98–111)
GFR calc Af Amer: >60 mL/min (ref >60)
GFR calc non Af Amer: >60 mL/min (ref >60)
Glucose, Bld: 133 mg/dL — ABNORMAL HIGH (ref 70–99)
Potassium: 3.3 mmol/L — ABNORMAL LOW (ref 3.5–5.1)
Sodium: 134 mmol/L — ABNORMAL LOW (ref 135–145)
Total Bilirubin: 1.1 mg/dL (ref 0.3–1.2)
Total Protein: 6.8 g/dL (ref 6.5–8.1)

## 2018-10-26 LAB — CBC
HEMATOCRIT: 49.2 % (ref 39.0–52.0)
Hemoglobin: 15.3 g/dL (ref 13.0–17.0)
MCH: 27.1 pg (ref 26.0–34.0)
MCHC: 31.1 g/dL (ref 30.0–36.0)
MCV: 87.1 fL (ref 80.0–100.0)
NRBC: 0 % (ref 0.0–0.2)
Platelets: 185 10*3/uL (ref 150–400)
RBC: 5.65 MIL/uL (ref 4.22–5.81)
RDW: 13.2 % (ref 11.5–15.5)
WBC: 10.5 10*3/uL (ref 4.0–10.5)

## 2018-10-26 LAB — ETHANOL: Alcohol, Ethyl (B): <10 mg/dL (ref <10)

## 2018-10-26 MED ORDER — LORAZEPAM 1 MG PO TABS
2.0000 mg | ORAL_TABLET | Freq: Once | ORAL | Status: AC
  Administered 2018-10-26: 2 mg via ORAL
  Filled 2018-10-26: qty 2
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
## 2018-10-26 NOTE — ED Notes (Signed)
Pt sitting in green zone hallway yelling for drink. Instructed pt to stop yelling for food and drink. Sandwich and Sprite given.
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
## 2018-10-26 NOTE — ED Notes (Signed)
Pt continues to talk loudly in hall and to everyone that passes. Pt noted to have head under water faucet in room.  Pt moved to [place] 11 on inpatient bed for comfort

## 2018-10-26 NOTE — ED Notes (Signed)
Pt stated that he had 55 dollars in pocket. Searched pockets with pt. Pt has no money. Only 55cent in change. Pt also had one bag marijuana. Gave to security

## 2018-10-27 ENCOUNTER — Emergency Department (HOSPITAL_COMMUNITY): Payer: Medicaid Other

## 2018-10-27 MED ORDER — LORAZEPAM 2 MG/ML IJ SOLN
1.0000 mg | Freq: Once | INTRAMUSCULAR | Status: AC
  Administered 2018-10-27: 1 mg via INTRAMUSCULAR
  Filled 2018-10-27: qty 1

## 2018-10-27 MED ORDER — HALOPERIDOL LACTATE 5 MG/ML IJ SOLN
5.0000 mg | Freq: Once | INTRAMUSCULAR | Status: AC
  Administered 2018-10-27: 5 mg via INTRAMUSCULAR
  Filled 2018-10-27: qty 1

## 2018-10-27 NOTE — Progress Notes (Signed)
Patient meets criteria for inpatient treatment. No appropriate or available beds at [facility]. CSW faxed referrals to the following facilities for review:  [facility], [facility], [facility], [place], [place]), [place], [place], [place], [place], [address], [place], [place], [address], [place], [place], [facility], [facility], [place], [place], [address] [place], [place], [place], and [place].  TTS will continue to seek bed placement.  Vilma Meckel. Algis Greenhouse, MSW, LCSW Clinical Social Work/Disposition Phone: [phone] Fax: [phone]

## 2018-10-27 NOTE — ED Notes (Signed)
Pt states he is supposed to have cpap at home, pt states he will wear one if we supply one.

## 2018-10-27 NOTE — ED Notes (Signed)
Pt made 1st phone call - aware of 2 5-minute phone calls/day.

## 2018-10-27 NOTE — ED Notes (Signed)
RT placed CPAP on patient.

## 2018-10-27 NOTE — ED Notes (Signed)
Congested cough noted, pt reports cough x 3 days with yellow/white mucous. MD aware

## 2018-10-27 NOTE — ED Notes (Signed)
Pt attempted to stand in hallway - returned to room as requested by staff. Pt noted to be talkative and singing loudly. Pt noted to be restless.

## 2018-10-27 NOTE — ED Notes (Signed)
Pt continues to make derogatory remarks to staff and visitors. Pt given another meal bag and sprite but now requesting additional sodas.  Pt yelling in hall, cursing.

## 2018-10-27 NOTE — ED Notes (Signed)
Pt removed CPAP, pt very argumentative regarding sleep apnea, pt now again cursing and speaking loudly in hall

## 2018-10-27 NOTE — BHH Counselor (Signed)
TTS attempted to re-assess patient, per nurse patient continues to be in the hallway. TTS will be notified when able to re-assess.

## 2018-10-27 NOTE — ED Notes (Signed)
Pt sitting in chair in hallway - pt returned to room as asked by staff - Performing push ups in hallway - encouraged pt to do them in room - pt agreeable. Pt noted to be talkative and singing loudly. RT assisted w/CPAP machine - pt return demonstrated use. Voiced understanding to use when sleeps.

## 2018-10-27 NOTE — ED Notes (Signed)
[facility] aware pt ready for re-TTS. Pt continues to deny SI/HI.

## 2018-10-27 NOTE — ED Notes (Signed)
Pt now awake - given snack as requested.

## 2018-10-27 NOTE — ED Notes (Signed)
Pt arrived to [facility] - ambulatory - wearing burgundy scrubs. Pt ate lunch - appears sleepy - states he came to ED by EMS d/t [facility] was closed. States his mother and brother had called d/t states "they said I wasn't acting right but I was. I was just sitting there smoking my Blunt". States he has been taking his BP meds when he feels like it and Remeron for sleep. States he received his "injection at [facility] on the 25th". Pt voiced understanding of Medical Clearance Pt Policy including phone use. Pt signed form.

## 2018-10-27 NOTE — ED Notes (Signed)
[facility]Kim, [facility], plan is for pt to be d/c'd to home - [facility] to [facility].

## 2018-10-27 NOTE — ED Notes (Signed)
Re-TTS being performed.  

## 2018-10-27 NOTE — ED Notes (Signed)
Discharged by psy and Dr. Freida Busman.  Pt given belongings back except for the marijuana which was destroyed by security.  Pt happy to be going home.  Walked pt out and showed him how to dial out to call family.  Pt verbally aknowledged understanding of discharge instructions.

## 2018-10-27 NOTE — ED Notes (Signed)
Pt to radiology for cxr 

## 2018-10-27 NOTE — ED Notes (Signed)
Staffing Office aware of need for [facility]. 

## 2018-10-27 NOTE — ED Notes (Signed)
Called to check on progress of diet tray

## 2018-10-27 NOTE — ED Provider Notes (Signed)
Pt cleared for d/c by [facility]. Seen by myself and has no acute complaints. No si/hi Pt stable for d/c   Lorre Nick, MD 10/27/18 1943

## 2018-10-27 NOTE — ED Notes (Signed)
Ordered diet tray for pt  

## 2018-10-27 NOTE — BHH Counselor (Signed)
Re-assessed: Patient initially assessed on 10/26/2018 after presenting to ED after verbal altercation with family. Patient voluntarily called police officers to come to hospital for psychiatric evaluation. Patient has history of bipolar and schizophrenia. Patient speech is hyper verbal and he continues to have flight of ideas, asking questions such as 'what was my HIV status' and asking the staff questions such as 'who's my psychiatrist.' Patient easily to be redirected.    Patient report he's feeling smart and refreshed. Patient report he was initial admitted to the hospital because he aunt and mother called the cops on him. Patient denies suicidal / homicidal ideations, denies auditory / visual hallucinations. Patient report he goes to [facility] for outpatient management. Report he lives with his mother and her boyfriend and states he's able to return home. Patient able to contact for safety.    Disposition: Caryn Bee, NP, patient is psych-cleared

## 2018-10-29 ENCOUNTER — Encounter (HOSPITAL_COMMUNITY): Payer: Self-pay

## 2018-10-29 ENCOUNTER — Other Ambulatory Visit: Payer: Self-pay

## 2018-10-29 ENCOUNTER — Inpatient Hospital Stay (HOSPITAL_COMMUNITY)
Admission: RE | Admit: 2018-10-29 | Discharge: 2018-11-05 | DRG: 885 | Disposition: A | Discharge status: Home / Self Care | Payer: Medicaid Other | Attending: Psychiatry | Admitting: Psychiatry

## 2018-10-29 DIAGNOSIS — E669 Obesity, unspecified: Secondary | ICD-10-CM | POA: Diagnosis present

## 2018-10-29 DIAGNOSIS — M1990 Unspecified osteoarthritis, unspecified site: Secondary | ICD-10-CM | POA: Diagnosis present

## 2018-10-29 DIAGNOSIS — G8929 Other chronic pain: Secondary | ICD-10-CM | POA: Diagnosis present

## 2018-10-29 DIAGNOSIS — F17210 Nicotine dependence, cigarettes, uncomplicated: Secondary | ICD-10-CM | POA: Diagnosis present

## 2018-10-29 DIAGNOSIS — G4700 Insomnia, unspecified: Secondary | ICD-10-CM | POA: Diagnosis present

## 2018-10-29 DIAGNOSIS — R4781 Slurred speech: Secondary | ICD-10-CM | POA: Diagnosis present

## 2018-10-29 DIAGNOSIS — F250 Schizoaffective disorder, bipolar type: Principal | ICD-10-CM | POA: Diagnosis present

## 2018-10-29 DIAGNOSIS — Z9114 Patient's other noncompliance with medication regimen: Secondary | ICD-10-CM

## 2018-10-29 DIAGNOSIS — I252 Old myocardial infarction: Secondary | ICD-10-CM

## 2018-10-29 DIAGNOSIS — Z6837 Body mass index (BMI) 37.0-37.9, adult: Secondary | ICD-10-CM

## 2018-10-29 DIAGNOSIS — F1220 Cannabis dependence, uncomplicated: Secondary | ICD-10-CM | POA: Diagnosis present

## 2018-10-29 DIAGNOSIS — M549 Dorsalgia, unspecified: Secondary | ICD-10-CM | POA: Diagnosis present

## 2018-10-29 MED ORDER — TRAZODONE HCL 100 MG PO TABS
100.0000 mg | ORAL_TABLET | Freq: Every evening | ORAL | Status: DC | PRN
  Administered 2018-10-29: 100 mg via ORAL
  Filled 2018-10-29: qty 1

## 2018-10-29 MED ORDER — HYDROXYZINE HCL 25 MG PO TABS
25.0000 mg | ORAL_TABLET | Freq: Three times a day (TID) | ORAL | Status: DC | PRN
  Administered 2018-10-29 – 2018-10-31 (×4): 25 mg via ORAL
  Filled 2018-10-29 (×2): qty 1

## 2018-10-29 MED ORDER — ALUM & MAG HYDROXIDE-SIMETH 200-200-20 MG/5ML PO SUSP: 30.0000 mL | ORAL | Status: DC | PRN

## 2018-10-29 MED ORDER — ACETAMINOPHEN 325 MG PO TABS
650.0000 mg | ORAL_TABLET | Freq: Four times a day (QID) | ORAL | Status: DC | PRN
  Administered 2018-10-30: 650 mg via ORAL
  Filled 2018-10-29: qty 2

## 2018-10-29 MED ORDER — OLANZAPINE 5 MG PO TBDP
5.0000 mg | ORAL_TABLET | Freq: Every day | ORAL | Status: DC
  Administered 2018-10-29: 5 mg via ORAL
  Filled 2018-10-29 (×3): qty 1

## 2018-10-29 MED ORDER — MAGNESIUM HYDROXIDE 400 MG/5ML PO SUSP: 30.0000 mL | Freq: Every day | ORAL | Status: DC | PRN

## 2018-10-29 NOTE — Progress Notes (Addendum)
Nursing Progress Note: 7p-7a D: Pt currently presents with a anxious/hyperactive/hypersexual/loud/pressured affect and behavior. Pt states "I need pills. I ned to go to sleep. I haven't slept in days." Interacting appropriately with the milieu. Pt reports poor sleep at home. Pt did attend wrap-up group.  A: Pt provided with medications per providers orders. Pt's labs and vitals were monitored throughout the night. Pt supported emotionally and encouraged to express concerns and questions. Pt educated on medications.  R: Pt's safety ensured with 15 minute and environmental checks. Pt currently denies SI, HI, and AVH. Pt verbally contracts to seek staff if SI,HI, or AVH occurs and to consult with staff before acting on any harmful thoughts. Will continue to monitor.

## 2018-10-29 NOTE — H&P (Addendum)
Behavioral Health Medical Screening Exam  Jake Sanchez is an 33 y.o. male with psychosis and worsening mania. He originally was present in the ED about 3 days ago with the like behaviros. He presents today with worsening flight of ideas, tangential, pressured speech. He denies any suicidal and or homicial ideations at this time. However due to worsening mental condition, history of schizophrenia and bipolar unstable and non compliant with medications will admit at this time.   Total Time spent with patient: 30 minutes  Psychiatric Specialty Exam: Physical Exam  ROS  There were no vitals taken for this visit.There is no height or weight on file to calculate BMI.  General Appearance: Disheveled  Eye Contact:  Fair  Speech:  Clear and Coherent and Pressured  Volume:  Increased  Mood:  Euphoric  Affect:  Congruent and Full Range  Thought Process:  Disorganized, Irrelevant and Descriptions of Associations: Loose  Orientation:  Negative  Thought Content:  Delusions, Rumination and Tangential  Suicidal Thoughts:  No  Homicidal Thoughts:  No  Memory:  Immediate;   Fair Recent;   Fair  Judgement:  Impaired  Insight:  Shallow  Psychomotor Activity:  Increased  Concentration: Concentration: Poor and Attention Span: Poor  Recall:  Poor  Fund of Knowledge:Poor  Language: Negative  Akathisia:  No  Handed:  Right  AIMS (if indicated):     Assets:  ArchitectCommunication Skills Financial Resources/Insurance Physical Health Vocational/Educational  Sleep:       Musculoskeletal: Strength & Muscle Tone: within normal limits Gait & Station: normal Patient leans: N/A  Blood pressure 93/78, pulse 94, temperature 98.2 F (36.8 C), temperature source Oral, resp. rate 16, SpO2 98 %.  Recommendations: Recommend Inpatient treatment at this time. Will admit to the 500 hall for crisis stabilization and medication management. Will obtain labs at this time. Started zyprexa zydis to target symptoms of  mania and psychosis, will titrate as appropriate as needed. Patient with elevated LFTS on previous ED visit, will need to repeat all labs. Elevated labs maybe due to alcohol use. He denies any recent alcohol use last drink as per patient last night. Will continue to montior.   Based on my evaluation the patient does not appear to have an emergency medical condition.  Jake Amosakia S Starkes-Perry, FNP 10/29/2018, 6:52 PM
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
## 2018-10-30 DIAGNOSIS — F250 Schizoaffective disorder, bipolar type: Principal | ICD-10-CM

## 2018-10-30 DIAGNOSIS — G4700 Insomnia, unspecified: Secondary | ICD-10-CM

## 2018-10-30 LAB — COMPREHENSIVE METABOLIC PANEL
ALT: 115 U/L — ABNORMAL HIGH (ref 0–44)
AST: 109 U/L — ABNORMAL HIGH (ref 15–41)
Albumin: 3.5 g/dL (ref 3.5–5.0)
Alkaline Phosphatase: 39 U/L (ref 38–126)
Anion gap: 11 (ref 5–15)
BILIRUBIN TOTAL: 0.4 mg/dL (ref 0.3–1.2)
BUN: 11 mg/dL (ref 6–20)
CO2: 25 mmol/L (ref 22–32)
Calcium: 8.6 mg/dL — ABNORMAL LOW (ref 8.9–10.3)
Chloride: 103 mmol/L (ref 98–111)
Creatinine, Ser: 0.88 mg/dL (ref 0.61–1.24)
GFR calc Af Amer: >60 mL/min (ref >60)
Glucose, Bld: 114 mg/dL — ABNORMAL HIGH (ref 70–99)
Potassium: 3.9 mmol/L (ref 3.5–5.1)
Sodium: 139 mmol/L (ref 135–145)
Total Protein: 6.9 g/dL (ref 6.5–8.1)

## 2018-10-30 LAB — URINALYSIS, ROUTINE W REFLEX MICROSCOPIC
BACTERIA UA: NONE SEEN
Bilirubin Urine: NEGATIVE
Glucose, UA: NEGATIVE mg/dL
Ketones, ur: NEGATIVE mg/dL
Leukocytes, UA: NEGATIVE
Nitrite: NEGATIVE
Protein, ur: NEGATIVE mg/dL
Specific Gravity, Urine: 1.012 (ref 1.005–1.030)
pH: 6 (ref 5.0–8.0)

## 2018-10-30 LAB — CBC
HEMATOCRIT: 46.8 % (ref 39.0–52.0)
Hemoglobin: 14.8 g/dL (ref 13.0–17.0)
MCH: 27.5 pg (ref 26.0–34.0)
MCHC: 31.6 g/dL (ref 30.0–36.0)
MCV: 86.8 fL (ref 80.0–100.0)
Platelets: 387 10*3/uL (ref 150–400)
RBC: 5.39 MIL/uL (ref 4.22–5.81)
RDW: 13.3 % (ref 11.5–15.5)
WBC: 10.1 10*3/uL (ref 4.0–10.5)
nRBC: 0 % (ref 0.0–0.2)

## 2018-10-30 LAB — LIPID PANEL
Cholesterol: 137 mg/dL (ref 0–200)
HDL: 31 mg/dL — ABNORMAL LOW (ref >40)
LDL Cholesterol: 94 mg/dL (ref 0–99)
TRIGLYCERIDES: 61 mg/dL (ref <150)
Total CHOL/HDL Ratio: 4.4 RATIO
VLDL: 12 mg/dL (ref 0–40)

## 2018-10-30 LAB — HEMOGLOBIN A1C
Hgb A1c MFr Bld: 5.9 % — ABNORMAL HIGH (ref 4.8–5.6)
Mean Plasma Glucose: 122.63 mg/dL

## 2018-10-30 LAB — RAPID URINE DRUG SCREEN, HOSP PERFORMED
Amphetamines: NOT DETECTED
Barbiturates: NOT DETECTED
Benzodiazepines: NOT DETECTED
Cocaine: NOT DETECTED
Opiates: NOT DETECTED
Tetrahydrocannabinol: POSITIVE — AB

## 2018-10-30 LAB — TSH: TSH: 0.951 u[IU]/mL (ref 0.350–4.500)

## 2018-10-30 MED ORDER — RISPERIDONE 1 MG PO TBDP
3.0000 mg | ORAL_TABLET | Freq: Two times a day (BID) | ORAL | Status: DC
  Administered 2018-10-30: 3 mg via ORAL
  Filled 2018-10-30 (×5): qty 3

## 2018-10-30 MED ORDER — DIPHENHYDRAMINE HCL 50 MG/ML IJ SOLN
50.0000 mg | Freq: Once | INTRAMUSCULAR | Status: AC
  Administered 2018-10-30: 50 mg via INTRAMUSCULAR
  Filled 2018-10-30: qty 1

## 2018-10-30 MED ORDER — DIPHENHYDRAMINE HCL 50 MG/ML IJ SOLN
INTRAMUSCULAR | Status: AC
  Administered 2018-10-30: 50 mg via INTRAMUSCULAR
  Filled 2018-10-30: qty 1

## 2018-10-30 MED ORDER — LORAZEPAM 1 MG PO TABS
2.0000 mg | ORAL_TABLET | ORAL | Status: DC | PRN
  Administered 2018-10-30 – 2018-11-04 (×5): 2 mg via ORAL
  Filled 2018-10-30 (×5): qty 2

## 2018-10-30 MED ORDER — DIPHENHYDRAMINE HCL 50 MG PO CAPS
50.0000 mg | ORAL_CAPSULE | Freq: Two times a day (BID) | ORAL | Status: DC
  Administered 2018-10-30: 50 mg via ORAL
  Filled 2018-10-30 (×5): qty 1

## 2018-10-30 MED ORDER — LORAZEPAM 2 MG/ML IJ SOLN
4.0000 mg | INTRAMUSCULAR | Status: DC | PRN
  Filled 2018-10-30 (×2): qty 2

## 2018-10-30 MED ORDER — ZIPRASIDONE MESYLATE 20 MG IM SOLR
20.0000 mg | Freq: Once | INTRAMUSCULAR | Status: AC
  Administered 2018-10-30: 20 mg via INTRAMUSCULAR
  Filled 2018-10-30 (×2): qty 20

## 2018-10-30 MED ORDER — TEMAZEPAM 15 MG PO CAPS
30.0000 mg | ORAL_CAPSULE | Freq: Every day | ORAL | Status: DC
  Administered 2018-10-30: 30 mg via ORAL
  Filled 2018-10-30: qty 2

## 2018-10-30 MED ORDER — TRAZODONE HCL 100 MG PO TABS
100.0000 mg | ORAL_TABLET | Freq: Every evening | ORAL | Status: DC | PRN
  Administered 2018-10-30 (×2): 100 mg via ORAL
  Filled 2018-10-30 (×4): qty 1

## 2018-10-30 NOTE — Progress Notes (Signed)
Recreation Therapy Notes  INPATIENT RECREATION THERAPY ASSESSMENT  [name] MRN: [id number] DOB: 1986/04/19 Today's Date: 10/30/2018       Information Obtained From: Patient  Able to Participate in Assessment/Interview: Yes  Patient Presentation: (Drowsy)  Reason for Admission (Per Patient): Other (Comments)(Pt stated his cousin brought him here to see if "I was really dumb or not".)  Patient Stressors: Other (Comment)("Not smoking weed, cigarettes or poppin percs")  Coping Skills:   TV, Sports, Arguments, Music, Exercise, Meditate, Substance Abuse, Talk, Art, Prayer, Avoidance, Hot Bath/Shower  Leisure Interests (2+):  Art - Coloring, Art - Draw  Frequency of Recreation/Participation: Other (Comment)(Bi-weekly)  Awareness of Community Resources:  Yes  Community Resources:  Park, Engineering geologist, Public affairs consultant, [employer]  Current Use: Yes  If no, Barriers?:    Expressed Interest in [employer] Information: No  [employer] of Residence:  [place]  Patient Main Form of Transportation: Walk  Patient Strengths:  Arms; Muscles  Patient Identified Areas of Improvement:  Stop lusting so much  Patient Goal for Hospitalization:  "Get out"  Current SI (including self-harm):  No  Current HI:  No  Current AVH: No  Staff Intervention Plan: Group Attendance, Collaborate with Interdisciplinary Treatment Team  Consent to Intern Participation: N/A     Caroll Rancher, LRT/CTRS  Caroll Rancher A 10/30/2018, 1:23 PM

## 2018-10-30 NOTE — Tx Team (Signed)
Interdisciplinary Treatment and Diagnostic Plan Update  10/30/2018 Time of Session: 12:00pm Jake Sanchez MRN: [id number]  Principal Diagnosis: Schizoaffective disorder, bipolar type ([place])  Secondary Diagnoses: Principal Problem:   Schizoaffective disorder, bipolar type ([place]) Active Problems:   Schizoaffective disorder, manic type ([place])   Current Medications:  Current Facility-Administered Medications  Medication Dose Route Frequency Provider Last Rate Last Dose  . acetaminophen (TYLENOL) tablet 650 mg  650 mg Oral Q6H PRN Suella Broad, FNP   650 mg at 10/30/18 0752  . alum & mag hydroxide-simeth (MAALOX/MYLANTA) 200-200-20 MG/5ML suspension 30 mL  30 mL Oral Q4H PRN Starkes-Perry, Gayland Curry, FNP      . diphenhydrAMINE (BENADRYL) capsule 50 mg  50 mg Oral BID Johnn Hai, MD      . hydrOXYzine (ATARAX/VISTARIL) tablet 25 mg  25 mg Oral TID PRN Suella Broad, FNP   25 mg at 10/29/18 2023  . LORazepam (ATIVAN) tablet 2 mg  2 mg Oral Q4H PRN Johnn Hai, MD       Or  . LORazepam (ATIVAN) injection 4 mg  4 mg Intramuscular Q4H PRN Johnn Hai, MD      . magnesium hydroxide (MILK OF MAGNESIA) suspension 30 mL  30 mL Oral Daily PRN Burt Ek, Gayland Curry, FNP      . risperiDONE (RISPERDAL M-TABS) disintegrating tablet 3 mg  3 mg Oral BID Johnn Hai, MD      . temazepam (RESTORIL) capsule 30 mg  30 mg Oral QHS Johnn Hai, MD      . traZODone (DESYREL) tablet 100 mg  100 mg Oral QHS,MR X 1 Simon, Spencer E, PA-C       PTA Medications: Medications Prior to Admission  Medication Sig Dispense Refill Last Dose  . asenapine (SAPHRIS) 5 MG SUBL 24 hr tablet Place 2 tablets (10 mg total) under the tongue 2 (two) times daily. (Patient not taking: Reported on 10/08/2016) 60 tablet 0 Not Taking at Unknown time  . aspirin 81 MG chewable tablet Chew 1 tablet (81 mg total) by mouth daily. For your heart. (Patient not taking: Reported on 10/08/2016)   Not Taking at Unknown time   . hydrocortisone (ANUSOL-HC) 2.5 % rectal cream Apply rectally 2 times daily (Patient not taking: Reported on 10/08/2016) 35 g 3 Not Taking at Unknown time  . metoprolol succinate (TOPROL-XL) 25 MG 24 hr tablet Take 2 tablets (50 mg total) by mouth daily. Medicine to treat your heart and high blood pressure. (Patient not taking: Reported on 10/08/2016) 30 tablet 0 Not Taking at Unknown time  . mirtazapine (REMERON) 15 MG tablet Take 1 tablet (15 mg total) by mouth daily. 30 tablet 0 10/26/2018 at Unknown time    Patient Stressors:  family relationships, Low socioeconomic status  Patient Strengths:    Treatment Modalities: Medication Management, Group therapy, Case management,  1 to 1 session with clinician, Psychoeducation, Recreational therapy.   Physician Treatment Plan for Primary Diagnosis: Schizoaffective disorder, bipolar type ([place]) Long Term Goal(s): Improvement in symptoms so as ready for discharge Improvement in symptoms so as ready for discharge   Short Term Goals: Ability to identify changes in lifestyle to reduce recurrence of condition will improve Ability to verbalize feelings will improve Ability to disclose and discuss suicidal ideas  Medication Management: Evaluate patient's response, side effects, and tolerance of medication regimen.  Therapeutic Interventions: 1 to 1 sessions, Unit Group sessions and Medication administration.  Evaluation of Outcomes: Not Met  Physician Treatment Plan for Secondary Diagnosis:  Principal Problem:   Schizoaffective disorder, bipolar type ([place]) Active Problems:   Schizoaffective disorder, manic type ([place])  Long Term Goal(s): Improvement in symptoms so as ready for discharge Improvement in symptoms so as ready for discharge   Short Term Goals: Ability to identify changes in lifestyle to reduce recurrence of condition will improve Ability to verbalize feelings will improve Ability to disclose and discuss suicidal ideas      Medication Management: Evaluate patient's response, side effects, and tolerance of medication regimen.  Therapeutic Interventions: 1 to 1 sessions, Unit Group sessions and Medication administration.  Evaluation of Outcomes: Not Met   RN Treatment Plan for Primary Diagnosis: Schizoaffective disorder, bipolar type ([place]) Long Term Goal(s): Knowledge of disease and therapeutic regimen to maintain health will improve  Short Term Goals: Ability to verbalize frustration and anger appropriately will improve, Ability to demonstrate self-control, Ability to verbalize feelings will improve, Ability to identify and develop effective coping behaviors will improve and Compliance with prescribed medications will improve  Medication Management: RN will administer medications as ordered by provider, will assess and evaluate patient's response and provide education to patient for prescribed medication. RN will report any adverse and/or side effects to prescribing provider.  Therapeutic Interventions: 1 on 1 counseling sessions, Psychoeducation, Medication administration, Evaluate responses to treatment, Monitor vital signs and CBGs as ordered, Perform/monitor CIWA, COWS, AIMS and Fall Risk screenings as ordered, Perform wound care treatments as ordered.  Evaluation of Outcomes: Not Met   LCSW Treatment Plan for Primary Diagnosis: Schizoaffective disorder, bipolar type ([place]) Long Term Goal(s): Safe transition to appropriate next level of care at discharge, Engage patient in therapeutic group addressing interpersonal concerns.  Short Term Goals: Engage patient in aftercare planning with referrals and resources, Increase social support, Increase ability to appropriately verbalize feelings, Increase emotional regulation, Identify triggers associated with mental health/substance abuse issues and Increase skills for wellness and recovery  Therapeutic Interventions: Assess for all discharge needs, 1 to 1 time  with Social worker, Explore available resources and support systems, Assess for adequacy in community support network, Educate family and significant other(s) on suicide prevention, Complete Psychosocial Assessment, Interpersonal group therapy.  Evaluation of Outcomes: Not Met   Progress in Treatment: Attending groups: No. Participating in groups: No. Taking medication as prescribed: Yes. Toleration medication: Yes. Family/Significant other contact made: No, will contact:  supports if consent is granted Patient understands diagnosis: Yes. Discussing patient identified problems/goals with staff: No. Medical problems stabilized or resolved: No. Denies suicidal/homicidal ideation: No. Issues/concerns per patient self-inventory: No.  New problem(s) identified: No, Describe:  CSW continuing to assess.   New Short Term/Long Term Goal(s): medication management for mood stabilization; elimination of SI thoughts; development of comprehensive mental wellness/sobriety plan.  Patient Goals:  Patient disorganized, unable to assess at this time.  Discharge Plan or Barriers: CSW continuing to assess. [place] pamphlet, Mobile Crisis information, and AA/NA information provided to patient for additional community support and resources.   Reason for Continuation of Hospitalization: Aggression Anxiety Mania Medication stabilization Suicidal ideation  Estimated Length of Stay: 7 days  Attendees: Patient: 10/30/2018 12:24 PM  Physician:  10/30/2018 12:24 PM  Nursing:  10/30/2018 12:24 PM  RN Care Manager: 10/30/2018 12:24 PM  Social Worker: Stephanie Acre, Latanya Presser 10/30/2018 12:24 PM  Recreational Therapist:  10/30/2018 12:24 PM  Other:  10/30/2018 12:24 PM  Other:  10/30/2018 12:24 PM  Other: 10/30/2018 12:24 PM    Scribe for Treatment Team: Joellen Jersey, [place] 10/30/2018 12:24 PM

## 2018-10-30 NOTE — [facility] Suicide Risk Assessment (Signed)
[facility] Admission Suicide Risk Assessment   Nursing information obtained from:  Patient Demographic factors:  Male, Low socioeconomic status Current Mental Status:  NA Loss Factors:  NA Historical Factors:  NA Risk Reduction Factors:  NA  Total Time spent with patient: 45 minutes Principal Problem: Schizoaffective disorder, bipolar type (HCC) Diagnosis:  Principal Problem:   Schizoaffective disorder, bipolar type (HCC) Active Problems:   Schizoaffective disorder, manic type (HCC)  Subjective Data: Manic pressured and disorganized clearly need of inpatient stabilization/hypersexual intrusive loud disruptive at the present time  Continued Clinical Symptoms:    The "Alcohol Use Disorders Identification Test", Guidelines for Use in Primary Care, Second Edition.  World Science writer [facility]). Score between 0-7:  no or low risk or alcohol related problems. Score between 8-15:  moderate risk of alcohol related problems. Score between 16-19:  high risk of alcohol related problems. Score 20 or above:  warrants further diagnostic evaluation for alcohol dependence and treatment.   CLINICAL FACTORS:   Bipolar Disorder:   Mixed State    COGNITIVE FEATURES THAT CONTRIBUTE TO RISK:  Polarized thinking    SUICIDE RISK:   Minimal: No identifiable suicidal ideation.  Patients presenting with no risk factors but with morbid ruminations; may be classified as minimal risk based on the severity of the depressive symptoms  PLAN OF CARE: Biggest danger present time is to others will stabilized with antipsychotics  I certify that inpatient services furnished can reasonably be expected to improve the patient's condition.   Malvin Johns, MD 10/30/2018, 10:20 AM

## 2018-10-30 NOTE — Progress Notes (Signed)
The patient attended the evening Wrap-Up Group but he slept for the entire duration.

## 2018-10-30 NOTE — Progress Notes (Signed)
Patient sleeping due to requiring IM medications. CSW unable to complete assessment with patient today. Will follow up tomorrow.  Enid Cutterharlotte Mekaila Tarnow, LCSW-A Clinical Social Worker

## 2018-10-30 NOTE — Progress Notes (Addendum)
Nursing Progress Note: 7p-7a D: Pt currently presents with a agitated/loud/whispering inappropriately/loose associations/disorganized  affect and behavior. Interacting minimally with the milieu. Pt reports poor sleep during the previous night with current medication regimen. Pt did attend wrap-up group.  A: Pt provided with medications per providers orders. Pt has a deep productive cough. Pt's labs and vitals were monitored throughout the night. Pt supported emotionally and encouraged to express concerns and questions. Pt educated on medications.  R: Pt's safety ensured with 15 minute and environmental checks. Pt currently denies SI, HI, and AVH. Pt verbally contracts to seek staff if SI,HI, or AVH occurs and to consult with staff before acting on any harmful thoughts. Will continue to monitor.

## 2018-10-30 NOTE — Progress Notes (Signed)
Recreation Therapy Notes  Date: 1.8.20 Time: 1000 Location: 500 Hall Dayroom  Group Topic: Goal Setting  Goal Area(s) Addresses:  Patient will be able to identify at least 3 life goals.  Patient will be able to identify obstacles to reaching goals.  Patient will be able to identify benefit of setting life goals post d/c.   Intervention: Worksheet  Activity: Garment/textile technologist.  Patients were to set goals they want to accomplish within the next week, month, year and five years.  Patients were to also identify obstacles that would hinder reaching goals, what they need to reach goals and what they could start doing today towards their goals.  Education:  Discharge Planning, Coping Skills, Goal Planning   Education Outcome: Acknowledges Education/In Group Clarification Provided/Needs Additional Education  Clinical Observations:  Pt did not attend group.    Caroll Rancher, LRT/CTRS         Lillia Abed, Raudel Bazen A 10/30/2018 11:18 AM

## 2018-10-30 NOTE — Therapy (Signed)
Occupational Therapy Group Note  Date:  10/30/2018 Time:  11:48 AM  Group Topic/Focus:  Stress Management  Participation Level:  Active  Participation Quality:  Intrusive  Affect:  Animated  Cognitive:  Disorganized  Insight: Lacking  Engagement in Group:  Off Topic  Modes of Intervention:  Activity, Discussion, Education and Socialization  Additional Comments:    S: "Oh stress management, you can just relax and chill and not be stressed"  O: Education given on stress management and healthy coping mechanisms. Pt encouraged to brainstorm with other peers and discuss what has worked in the past vs what has not. Pts further encouraged to discuss new coping stress management strategies to implement this date. Art activity made to display preferred coping mechanisms, along with incorporating the stress management outlet of coloring/art.   A: Pt presents to group with animated affect, intrusive, attention seeking and needing direction throughout group. Pt came to group late, needing instruction to be on track with others. Pt completed art activity- not willing to put coping skills down. Excessively requesting OT to show him how to do activity to touch OT's hands, and making inappropriate comments. Resolves with consistent redirection.  P: Pt provided with education on stress management activities to implement into daily routine. Handouts given to facilitate carryover when reintegrating into community   Jake Sanchez, MSOT, OTR/L [employer] OT/ Acute Relief OT PHP Office: [phone]  Jake Sanchez 10/30/2018, 11:48 AM
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
## 2018-10-31 MED ORDER — ZIPRASIDONE MESYLATE 20 MG IM SOLR
INTRAMUSCULAR | Status: AC
  Filled 2018-10-31: qty 20

## 2018-10-31 MED ORDER — TRAZODONE HCL 150 MG PO TABS
150.0000 mg | ORAL_TABLET | Freq: Every day | ORAL | Status: DC
  Administered 2018-10-31 – 2018-11-04 (×5): 150 mg via ORAL
  Filled 2018-10-31 (×8): qty 1

## 2018-10-31 MED ORDER — LORAZEPAM 2 MG/ML IJ SOLN
4.0000 mg | Freq: Once | INTRAMUSCULAR | Status: AC
  Administered 2018-10-31: 4 mg via INTRAMUSCULAR

## 2018-10-31 MED ORDER — DIPHENHYDRAMINE HCL 50 MG PO CAPS
50.0000 mg | ORAL_CAPSULE | Freq: Three times a day (TID) | ORAL | Status: DC
  Administered 2018-10-31 – 2018-11-04 (×13): 50 mg via ORAL
  Filled 2018-10-31 (×16): qty 1
  Filled 2018-10-31 (×2): qty 2

## 2018-10-31 MED ORDER — TEMAZEPAM 15 MG PO CAPS
45.0000 mg | ORAL_CAPSULE | Freq: Every day | ORAL | Status: DC
  Administered 2018-10-31 – 2018-11-04 (×5): 45 mg via ORAL
  Filled 2018-10-31: qty 6
  Filled 2018-10-31 (×4): qty 3

## 2018-10-31 MED ORDER — HALOPERIDOL 5 MG PO TABS
10.0000 mg | ORAL_TABLET | Freq: Three times a day (TID) | ORAL | Status: DC
  Administered 2018-10-31: 10 mg via ORAL
  Filled 2018-10-31 (×2): qty 2

## 2018-10-31 MED ORDER — ZIPRASIDONE MESYLATE 20 MG IM SOLR
20.0000 mg | Freq: Once | INTRAMUSCULAR | Status: DC
  Filled 2018-10-31: qty 20

## 2018-10-31 MED ORDER — HALOPERIDOL 5 MG PO TABS
10.0000 mg | ORAL_TABLET | Freq: Three times a day (TID) | ORAL | Status: DC
  Administered 2018-10-31 – 2018-11-01 (×3): 10 mg via ORAL
  Filled 2018-10-31 (×6): qty 2

## 2018-10-31 MED ORDER — ZIPRASIDONE MESYLATE 20 MG IM SOLR
20.0000 mg | Freq: Once | INTRAMUSCULAR | Status: AC
  Administered 2018-10-31: 20 mg via INTRAMUSCULAR

## 2018-10-31 MED ORDER — DIPHENHYDRAMINE HCL 50 MG/ML IJ SOLN
50.0000 mg | Freq: Once | INTRAMUSCULAR | Status: DC
  Filled 2018-10-31: qty 1

## 2018-10-31 NOTE — Progress Notes (Signed)
Patient is awake and will not go to sleep despite staff encouragement to do so. Patient is making requests half a dozen times per hour and is very loud.

## 2018-10-31 NOTE — B[facility] Counselor (Signed)
Adult Comprehensive Assessment  Patient ID: Jake Sanchez, male   DOB: 1986/06/15, 33 y.o.   MRN: [id number]  CSW attempted to complete PSA with patient. Patient insisting on speaking with CSW in room for privacy, rather than hallway. Patient began masterbating under blanket while speaking to CSW. CSW stated this was inappropriate and stated patient would need to speak to CSW by the phones in the hallway. Patient met CSW by phones; provided limited answers and appeared to fall sleep or would close his eyes. Patient exhibited same sexually inappropriate behaviors with TTS clinician upon intake:  Pt began making sexual sound and gestures when this clinician came in the room. Pt would rub his genitals through his pants and would push down on his pants and as clinician left the room made gestures to masturbate. Pt made comments regarding" I need pussy big ass pussy on my dick everyday" and " are you my nurse please be my nurse".  As such, information for assessment obtained from H&P. Patient denies consents for family supports to complete PSA at this time.   Information Source: Information source: Patient   Patient goal: "Take meds and leave."  Current Stressors:    Family relationships Low socioeconomic status  Living/Environment/Situation:  Living Arrangements: Lives with brother in [place]  Family History:  Marital status: Single  Childhood History:   Unable to assess  Education:   Unable to assess  Employment/Work Situation:   Employment situation: Sports coach Resources:    Medicaid  Alcohol/Substance Abuse:   What has been your use of drugs/alcohol within the last 12 months?: Cannabis ecstacy etoh Reports drinking approx. 8 days per weeks, "a couple beers a day." Last drink was on New Years.  Social Support System:    Unable to assess. Lives with family.   Leisure/Recreation:    Unable to assess.  Strengths/Needs:    Unable to assess  Discharge  Plan:    Patient plans to return home. Says he is current with outpatient therapist and medication provider; unable to assess whom. Patient says his therapist is "Mickie Hillier" and is with [facility], which is a home health provider.   Summary/Recommendations:   Summary and Recommendations (to be completed by the evaluator): Jake Sanchez is a 33 year old male from [place], voluntarily presenting to [facility] seeking treatment for SI and depression. Patient diagnosed with schizoaffective, bipolar type disorder. Patient is manic, intrusive, and sexually inappropraite on unit; making sexual sounds and gestures with clinicians and masterbating infront of others. Patient is a poor historian and provides vague answers. This is the patient's first [facility] admission. Patient reports he is current with outpatient providers. Patient would benefit from crisis stabilization, therapeutic milieu, medication management, and referral services.   Joellen Jersey. 10/31/2018

## 2018-10-31 NOTE — Progress Notes (Signed)
[facility] Mental [employer] Association Group Therapy 10/31/2018 2:11 PM  Type of Therapy: Mental Health Association Presentation  Participation Level: Left group early, feeling drowsy from medications  Summary of Progress/Problems: Mental Health Association [facility]) Speaker came to talk about his personal journey with mental health. The pt processed ways by which to relate to the speaker. MHA speaker provided handouts and educational information pertaining to groups and services offered by the [facility]. Pt was engaged in speaker's presentation and was receptive to resources provided.   Enid Cutter, MSW, [facility] 10/31/2018 2:11 PM

## 2018-10-31 NOTE — Plan of Care (Signed)
  Problem: Education: Goal: Will be free of psychotic symptoms Outcome: Not Progressing   Problem: Health Behavior/Discharge Planning: Goal: Compliance with prescribed medication regimen will improve Outcome: Progressing  DAR NOTE: Patient is delusional and disorganized with tangential thought process.  Observed pacing the unit and responding to internal stimuli.  Presents with irritable affect earlier during the shift.  Patient is impulsive and intrusive but is redirectable.  Denies suicidal thoughts, auditory and visual hallucinations.  Rates depression at 0, hopelessness at 10, and anxiety at 3.  Maintained on routine safety checks.  Medications given as prescribed.  Support and encouragement offered as needed.  Attended group and participated.  States goal for today is "Sunday dinner with mom and to chill out at my hotel."  Patient is visible in milieu interacting with staff and peers.  Patient remained safe on and off the unit. Offered no complaint.

## 2018-10-31 NOTE — [facility] Suicide Risk Assessment (Signed)
[facility] INPATIENT:  Family/Significant Other Suicide Prevention Education  Suicide Prevention Education:  Patient Refusal for Family/Significant Other Suicide Prevention Education: The patient Jake Sanchez has refused to provide written consent for family/significant other to be provided Family/Significant Other Suicide Prevention Education during admission and/or prior to discharge.  Physician notified.  Darreld Mclean 10/31/2018, 12:11 PM

## 2018-10-31 NOTE — Progress Notes (Signed)
Did not attend group 

## 2018-10-31 NOTE — Progress Notes (Signed)
Recreation Therapy Notes  Date: 1.9.20 Time: 1000 Location:  500 [address] Dayroom  Group Topic: Communication, Team Building, Problem Solving  Goal Area(s) Addresses:  Patient will effectively work with peer towards shared goal.  Patient will identify skills used to make activity successful.  Patient will identify how skills used during activity can be used to reach post d/c goals.   Intervention: STEM Activity  Activity: Stage manager. In teams patients were given 12 plastic drinking straws and a length of masking tape. Using the materials provided patients were asked to build a landing pad to catch a golf ball dropped from approximately 6 feet in the air.   Education: Pharmacist, community, Discharge Planning   Education Outcome: Acknowledges education/In group clarification offered/Needs additional education.   Clinical Observations/Feedback: Pt did not attend group.     Caroll Rancher,  LRT/CTRS         Caroll Rancher A 10/31/2018 12:22 PM
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
## 2018-10-31 NOTE — Progress Notes (Signed)
Nursing Progress Note: 7p-7a D: Pt currently presents with a hyperactive/preocupied/manic/delusional affect and behavior. Pt states "I dont need to sleep." Interacting appropriately with the milieu. Pt reports good sleep during the previous night with current medication regimen. Pt did attend wrap-up group.  A: Pt provided with medications per providers orders. Pt's labs and vitals were monitored throughout the night. Pt supported emotionally and encouraged to express concerns and questions. Pt educated on medications.  R: Pt's safety ensured with 15 minute and environmental checks. Pt currently denies SI, HI, and AVH. Pt verbally contracts to seek staff if SI,HI, or AVH occurs and to consult with staff before acting on any harmful thoughts. Will continue to monitor.

## 2018-10-31 NOTE — Progress Notes (Addendum)
CSW attempted to complete PSA with patient. Patient insisting on speaking with CSW in room for privacy, rather than hallway. Patient began masterbating under blanket while speaking to CSW. CSW stated this was inappropriate and stated patient would need to speak to CSW by the phones in the hallway. Patient met CSW by phones; provided limited answers and appeared to fall sleep or would close his eyes.   CSW informed psychiatry.  Stephanie Acre, LCSW-A Clinical Social Worker

## 2018-11-01 MED ORDER — GABAPENTIN 300 MG PO CAPS
300.0000 mg | ORAL_CAPSULE | Freq: Three times a day (TID) | ORAL | Status: DC
  Administered 2018-11-01 – 2018-11-05 (×13): 300 mg via ORAL
  Filled 2018-11-01 (×19): qty 1

## 2018-11-01 MED ORDER — HALOPERIDOL 5 MG PO TABS
7.0000 mg | ORAL_TABLET | Freq: Three times a day (TID) | ORAL | Status: DC
  Administered 2018-11-01 – 2018-11-04 (×9): 7 mg via ORAL
  Filled 2018-11-01 (×13): qty 1

## 2018-11-01 MED ORDER — FLUOXETINE HCL 20 MG PO CAPS
20.0000 mg | ORAL_CAPSULE | Freq: Every day | ORAL | Status: DC
  Administered 2018-11-01 – 2018-11-04 (×4): 20 mg via ORAL
  Filled 2018-11-01 (×5): qty 1
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
## 2018-11-01 NOTE — Plan of Care (Signed)
  Problem: Role Relationship: Goal: Ability to interact with others will improve Outcome: Not Progressing   Problem: Coping: Goal: Ability to identify and develop effective coping behavior will improve Outcome: Not Progressing  Dar Note:  Patient continues to need a  lot of redirection on the unit.  Patient is still intrusive with poor impulse control.  Presents with blunted affect and disorganized behavior.  Observed talking to himself and responding to internal stimuli.  Medications given as prescribed.  Denies suicidal thoughts and pain.  Routine safety checks maintained every 15 minutes.  Attended group and participated.  Offered support and encouragement as needed.  Visible in milieu and pacing the hallway at times with his top off.  Patient is safe on the unit.

## 2018-11-01 NOTE — Progress Notes (Signed)
Writer spoke with patient 1:1 atthe nursing station. He reports that his day has been wonderful. His conversation and thoughts are disorganized,delusional and tangential. He reports that he served time in prison for taking his moms cell phone. Patient went into dayroom after our brief interaction and was asleep in the dayroom.Safety maintained on unit with 15 min checks.

## 2018-11-01 NOTE — Progress Notes (Signed)
Was invited to group.  Did not attend   Group Description  Group focused on topic of "hope"   Patients engaged in facilitated dialog and visual explorer activity.  Worked to describe concept of hope in their lives and name experiences of hope.    Group facilitation focused on spiritual care and drew on Adlerian and elements of ACT  Pt participation:

## 2018-11-01 NOTE — Progress Notes (Signed)
Recreation Therapy Notes  Date: 1.10.19 Time: 1000 Location: 500 Hall Dayroom  Group Topic: Triggers  Goal Area(s) Addresses:  Patient will identify triggers.  Patient will verbalize benefit of avoiding triggers. Patient will verbalize ways to face triggers head on.  Intervention: Worksheet  Activity: Triggers.  Patients were to identify their biggest triggers.  Patients were to also identify how they could avoid or reduce exposure to triggers and how they could face triggers head on when they couldn't be avoided.  Education: Triggers, Discharge Planning  Education Outcome: Acknowledges understanding/In group clarification offered/Needs additional education.   Clinical Observations/Feedback: Pt did not attend group.      Neeka Urista, LRT/CTRS         Kahleel Fadeley A 11/01/2018 11:30 AM 

## 2018-11-01 NOTE — Progress Notes (Signed)
Adult Psychoeducational Group Note  Date:  11/01/2018 Time:  8:53 PM  Group Topic/Focus:  Wrap-Up Group:   The focus of this group is to help patients review their daily goal of treatment and discuss progress on daily workbooks.  Participation Level:  Active  Participation Quality:  Appropriate  Affect:  Appropriate  Cognitive:  Appropriate  Insight: Appropriate  Engagement in Group:  Engaged  Modes of Intervention:  Discussion  Additional Comments:  The patient expressed that he rates today a 8.The patient also said that he participated in group.  Octavio Manns 11/01/2018, 8:53 PM

## 2018-11-02 MED ORDER — GUAIFENESIN-CODEINE 100-10 MG/5ML PO SOLN
5.0000 mL | ORAL | Status: DC | PRN
  Administered 2018-11-02 – 2018-11-04 (×4): 5 mL via ORAL
  Filled 2018-11-02 (×5): qty 5
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
## 2018-11-02 NOTE — [facility] Group Notes (Addendum)
[facility] Group Notes:  (Nursing/MHT/Case Management/Adjunct)  Date:  11/02/2018  Time:  0930am Type of Therapy:  Nurse Education  Participation Level:  Active  Participation Quality:  Monopolizing, Redirectable and Sharing  Affect:  Excited  Cognitive:  Alert and Disorganized  Insight:  Lacking  Engagement in Group:  Distracting, Monopolizing and Off Topic  Modes of Intervention:  Discussion and Education  Summary of Progress/Problems: In this group, we discussed sleep hygiene to include helpful techniques for getting a better night's rest. Patients were asked to share some of their favorite techniques, and we discussed the pros and cons of each. The patient was engaged and participated appropriately.   Kirstie Mirza 11/02/2018, 4:31 PM

## 2018-11-02 NOTE — Progress Notes (Signed)
Writer has observed patient sitting up in the dayroom asleep. He at first seemed to be watching tv but soon drifted off to sleep. He will wake up when prompted to wake up but is not aware that he is nodding off to sleep. Writer spoke with him and inquired about his day. He reports having a a wonderful day and he stated that he loves it here. He reported that the food is good  and how he is gaining weight. He stayed on track with our conversation and responded appropriately and then he started having flight of ideas and his conversation was on many topics. He was compliant with medications. Safety maintained on unit with 15 min checks

## 2018-11-02 NOTE — [facility] Group Notes (Signed)
  [facility] LCSW Group Therapy Note  Date/Time:  11/02/2018 11:15AM-12:00PM  Type of Therapy and Topic:  Group Therapy:  Feelings About Hospitalization  Participation Level:  Active   Description of Group This process group involved patients discussing their feelings related to being hospitalized, as well as the benefits they see to being in the hospital.  These feelings and benefits were itemized.  The group then brainstormed specific ways in which they could seek those same benefits when they discharge and return home.  Therapeutic Goals 1. Patient will identify and describe positive and negative feelings related to hospitalization 2. Patient will verbalize benefits of hospitalization to themselves personally 3. Patients will brainstorm together ways they can obtain similar benefits in the outpatient setting, identify barriers to wellness and possible solutions  Summary of Patient Progress:  The patient expressed his primary feelings about being hospitalized are "wonderful but I want to move forward."  He stated he has been a lot of different facilities and realizes he needs to sleep and do what is expected of him at home such as cleaning up after himself and his friends.  He said he does want to stay out of the hospital.  An ACTT Team in the past was helpful to him in this regard.  He stated that if he misses his medicine for a week, that will prove that he does not need it.  An explanation about this not being the case since medicine can take some time to leave our system was not effective with him.  Therapeutic Modalities Cognitive Behavioral Therapy Motivational Interviewing    Ambrose Mantle, LCSW 11/02/2018, 8:55 AM

## 2018-11-02 NOTE — Plan of Care (Signed)
  Problem: Safety: Goal: Ability to redirect hostility and anger into socially appropriate behaviors will improve Outcome: Progressing Goal: Ability to remain free from injury will improve Outcome: Progressing   D: Pt alert and oriented on the unit. Pt engaging with RN staff and other pts. Pt denies SI/HI, A/VH. Pt attended and participated during groups today.  Pt rated his depression, feelings of hopelessness, and anxiety all a 0, respectively. Pt's goal for today is to "act normal, act like nothing." Pt spent most of the day in the hallway and at the nurses' station. Pt talked loudly but was redirectable. Pt is cooperative on the unit. A: Education, support and encouragement provided, q15 minute safety checks remain in effect. Medications administered per MD orders. R: No reactions/side effects to medicine noted. Pt denies any concerns at this time, and verbally contracts for safety. Pt ambulating on the unit with no issues. Pt remains safe on and off the unit.

## 2018-11-03 MED ORDER — DIVALPROEX SODIUM ER 500 MG PO TB24
500.0000 mg | ORAL_TABLET | Freq: Every day | ORAL | Status: DC
  Administered 2018-11-03 – 2018-11-05 (×3): 500 mg via ORAL
  Filled 2018-11-03 (×6): qty 1
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
## 2018-11-03 NOTE — Plan of Care (Addendum)
  Problem: Activity: Goal: Will verbalize the importance of balancing activity with adequate rest periods Outcome: Not Progressing   Problem: Health Behavior/Discharge Planning: Goal: Compliance with prescribed medication regimen will improve Outcome: Progressing   D: Pt alert and oriented on the unit. Pt engaging with RN staff and other pts. Pt denies SI/HI, A/VH. Pt talked loudly on the telephone and loudly  conversed with other pts while in the dayroom and hallway. Pt was redirectable and slept in his room for about an hour. Pt awoke and sat in the dayroom and watched television. Pt also participated during unit groups and activities. Pt cooperative. A: Education, support and encouragement provided, q15 minute safety checks remain in effect. Medications administered per MD orders. R: No reactions/side effects to medicine noted. Pt denies any concerns at this time, and verbally contracts for safety. Pt ambulating on the unit with no issues. Pt remains safe on and off the unit.

## 2018-11-03 NOTE — [facility] Group Notes (Signed)
[facility] LCSW Group Therapy Note  Date/Time:  11/03/2018  11:00AM-12:00PM  Type of Therapy and Topic:  Group Therapy:  Music and Mood  Participation Level:  Active   Description of Group: In this process group, members listened to a variety of genres of music and identified that different types of music evoke different responses.  Patients were encouraged to identify music that was soothing for them and music that was energizing for them.  Patients discussed how this knowledge can help with wellness and recovery in various ways including managing depression and anxiety as well as encouraging healthy sleep habits.    Therapeutic Goals: 1. Patients will explore the impact of different varieties of music on mood 2. Patients will verbalize the thoughts they have when listening to different types of music 3. Patients will identify music that is soothing to them as well as music that is energizing to them 4. Patients will discuss how to use this knowledge to assist in maintaining wellness and recovery 5. Patients will explore the use of music as a coping skill  Summary of Patient Progress:  At the beginning of group, patient expressed that he was happy but slightly drowsy.  He rated this only a 2 on a 1-10 scale.  Initially he was very active with the music, singing along.  Then he became restless and kept moving from place to place.  He eventually dozed off and snored very loudly.  When he woke up once, he was told this was happening and decided to go walk the hall to wake up.  At the end of group he was seen in the hall and he expressed thanks for the group, saying he had enjoyed it.  Therapeutic Modalities: Solution Focused Brief Therapy Activity   Ambrose Mantle, LCSW

## 2018-11-04 MED ORDER — HALOPERIDOL 5 MG PO TABS
5.0000 mg | ORAL_TABLET | Freq: Two times a day (BID) | ORAL | Status: DC
  Administered 2018-11-04 – 2018-11-05 (×2): 5 mg via ORAL
  Filled 2018-11-04 (×6): qty 1

## 2018-11-04 MED ORDER — ARIPIPRAZOLE 2 MG PO TABS
2.0000 mg | ORAL_TABLET | Freq: Once | ORAL | Status: AC
  Administered 2018-11-04: 2 mg via ORAL
  Filled 2018-11-04: qty 1

## 2018-11-04 MED ORDER — FLUOXETINE HCL 20 MG PO CAPS
40.0000 mg | ORAL_CAPSULE | Freq: Every day | ORAL | Status: DC
  Administered 2018-11-05: 40 mg via ORAL
  Filled 2018-11-04 (×3): qty 2

## 2018-11-04 MED ORDER — HALOPERIDOL 5 MG PO TABS
10.0000 mg | ORAL_TABLET | Freq: Every day | ORAL | Status: DC
  Administered 2018-11-04: 10 mg via ORAL
  Filled 2018-11-04 (×3): qty 2

## 2018-11-04 MED ORDER — DIPHENHYDRAMINE HCL 25 MG PO CAPS
25.0000 mg | ORAL_CAPSULE | Freq: Three times a day (TID) | ORAL | Status: DC
  Administered 2018-11-04 – 2018-11-05 (×4): 25 mg via ORAL
  Filled 2018-11-04 (×9): qty 1

## 2018-11-04 NOTE — [facility] Group Notes (Signed)
[facility] LCSW Group Therapy Note  Date/Time: 11/04/18, 1300  Type of Therapy and Topic:  Group Therapy:  Overcoming Obstacles  Participation Level:  none  Description of Group:    In this group patients will be encouraged to explore what they see as obstacles to their own wellness and recovery. They will be guided to discuss their thoughts, feelings, and behaviors related to these obstacles. The group will process together ways to cope with barriers, with attention given to specific choices patients can make. Each patient will be challenged to identify changes they are motivated to make in order to overcome their obstacles. This group will be process-oriented, with patients participating in exploration of their own experiences as well as giving and receiving support and challenge from other group members.  Therapeutic Goals: 1. Patient will identify personal and current obstacles as they relate to admission. 2. Patient will identify barriers that currently interfere with their wellness or overcoming obstacles.  3. Patient will identify feelings, thought process and behaviors related to these barriers. 4. Patient will identify two changes they are willing to make to overcome these obstacles:    Summary of Patient Progress: Pt was sound asleep and snoring loudly.  CSW woke pt several times and finally asked him to leave as the snoring was disruptive.      Therapeutic Modalities:   Cognitive Behavioral Therapy Solution Focused Therapy Motivational Interviewing Relapse Prevention Therapy  Daleen Squibb, LCSW

## 2018-11-04 NOTE — Progress Notes (Signed)
D: Pt denies SI/HI/AVH. Pt is pleasant and cooperative. Pt been in room majority of evening. A: Pt was offered support and encouragement. Pt was given scheduled medications. Pt was encourage to attend groups. Q 15 minute checks were done for safety.  R:Pt attends groups and interacts well with peers and staff. Pt is taking medication. Pt has no complaints.Pt receptive to treatment and safety maintained on unit.  Problem: Coping: Goal: Coping ability will improve Outcome: Progressing   Problem: Health Behavior/Discharge Planning: Goal: Compliance with prescribed medication regimen will improve Outcome: Progressing

## 2018-11-04 NOTE — Progress Notes (Signed)
Patient has been up and active on the unit, attended group this evening and has voiced no complaints. Patient currently denies having pain, -si/hi/a/v hall. Support and encouragement offered, safety maintained on unit, will continue to monitor.  

## 2018-11-04 NOTE — Tx Team (Signed)
Interdisciplinary Treatment and Diagnostic Plan Update  11/04/2018 Time of Session: 0900 Jake Sanchez MRN: [id number]  Principal Diagnosis: Schizoaffective disorder, bipolar type (HCC)  Secondary Diagnoses: Principal Problem:   Schizoaffective disorder, bipolar type (HCC) Active Problems:   Schizoaffective disorder, manic type (HCC)   Current Medications:  Current Facility-Administered Medications  Medication Dose Route Frequency Provider Last Rate Last Dose  . acetaminophen (TYLENOL) tablet 650 mg  650 mg Oral Q6H PRN Maryagnes AmosStarkes-Perry, Takia S, FNP   650 mg at 10/30/18 0752  . alum & mag hydroxide-simeth (MAALOX/MYLANTA) 200-200-20 MG/5ML suspension 30 mL  30 mL Oral Q4H PRN Rosario AdieStarkes-Perry, Juel Burrowakia S, FNP      . ARIPiprazole (ABILIFY) tablet 2 mg  2 mg Oral Once Malvin JohnsFarah, Brian, MD      . diphenhydrAMINE (BENADRYL) capsule 25 mg  25 mg Oral TID Malvin JohnsFarah, Brian, MD      . diphenhydrAMINE (BENADRYL) injection 50 mg  50 mg Intramuscular Once Malvin JohnsFarah, Brian, MD      . divalproex (DEPAKOTE ER) 24 hr tablet 500 mg  500 mg Oral Daily Oneta RackLewis, Tanika N, NP   500 mg at 11/04/18 0818  . [START ON 11/05/2018] FLUoxetine (PROZAC) capsule 40 mg  40 mg Oral Daily Malvin JohnsFarah, Brian, MD      . gabapentin (NEURONTIN) capsule 300 mg  300 mg Oral TID Malvin JohnsFarah, Brian, MD   300 mg at 11/04/18 0818  . guaiFENesin-codeine 100-10 MG/5ML solution 5 mL  5 mL Oral Q4H PRN Oneta RackLewis, Tanika N, NP   5 mL at 11/03/18 1812  . haloperidol (HALDOL) tablet 10 mg  10 mg Oral QHS Malvin JohnsFarah, Brian, MD      . haloperidol (HALDOL) tablet 5 mg  5 mg Oral BID Malvin JohnsFarah, Brian, MD      . hydrOXYzine (ATARAX/VISTARIL) tablet 25 mg  25 mg Oral TID PRN Maryagnes AmosStarkes-Perry, Takia S, FNP   25 mg at 10/31/18 2111  . LORazepam (ATIVAN) tablet 2 mg  2 mg Oral Q4H PRN Malvin JohnsFarah, Brian, MD   2 mg at 11/03/18 2035   Or  . LORazepam (ATIVAN) injection 4 mg  4 mg Intramuscular Q4H PRN Malvin JohnsFarah, Brian, MD      . magnesium hydroxide (MILK OF MAGNESIA) suspension 30 mL  30 mL Oral  Daily PRN Starkes-Perry, Juel Burrowakia S, FNP      . temazepam (RESTORIL) capsule 45 mg  45 mg Oral QHS Malvin JohnsFarah, Brian, MD   45 mg at 11/03/18 2035  . traZODone (DESYREL) tablet 150 mg  150 mg Oral QHS Malvin JohnsFarah, Brian, MD   150 mg at 11/03/18 2035  . ziprasidone (GEODON) injection 20 mg  20 mg Intramuscular Once Malvin JohnsFarah, Brian, MD       PTA Medications: Medications Prior to Admission  Medication Sig Dispense Refill Last Dose  . asenapine (SAPHRIS) 5 MG SUBL 24 hr tablet Place 2 tablets (10 mg total) under the tongue 2 (two) times daily. (Patient not taking: Reported on 10/08/2016) 60 tablet 0 Not Taking at Unknown time  . aspirin 81 MG chewable tablet Chew 1 tablet (81 mg total) by mouth daily. For your heart. (Patient not taking: Reported on 10/08/2016)   Not Taking at Unknown time  . hydrocortisone (ANUSOL-HC) 2.5 % rectal cream Apply rectally 2 times daily (Patient not taking: Reported on 10/08/2016) 35 g 3 Not Taking at Unknown time  . metoprolol succinate (TOPROL-XL) 25 MG 24 hr tablet Take 2 tablets (50 mg total) by mouth daily. Medicine to treat your heart and high blood  pressure. (Patient not taking: Reported on 10/08/2016) 30 tablet 0 Not Taking at Unknown time  . mirtazapine (REMERON) 15 MG tablet Take 1 tablet (15 mg total) by mouth daily. 30 tablet 0 10/26/2018 at Unknown time    Patient Stressors:  family relationships, Low socioeconomic status  Patient Strengths:    Treatment Modalities: Medication Management, Group therapy, Case management,  1 to 1 session with clinician, Psychoeducation, Recreational therapy.   Physician Treatment Plan for Primary Diagnosis: Schizoaffective disorder, bipolar type (HCC) Long Term Goal(s): Improvement in symptoms so as ready for discharge Improvement in symptoms so as ready for discharge   Short Term Goals: Ability to identify changes in lifestyle to reduce recurrence of condition will improve Ability to verbalize feelings will improve Ability to disclose  and discuss suicidal ideas  Medication Management: Evaluate patient's response, side effects, and tolerance of medication regimen.  Therapeutic Interventions: 1 to 1 sessions, Unit Group sessions and Medication administration.  Evaluation of Outcomes: Progressing  Physician Treatment Plan for Secondary Diagnosis: Principal Problem:   Schizoaffective disorder, bipolar type (HCC) Active Problems:   Schizoaffective disorder, manic type (HCC)  Long Term Goal(s): Improvement in symptoms so as ready for discharge Improvement in symptoms so as ready for discharge   Short Term Goals: Ability to identify changes in lifestyle to reduce recurrence of condition will improve Ability to verbalize feelings will improve Ability to disclose and discuss suicidal ideas     Medication Management: Evaluate patient's response, side effects, and tolerance of medication regimen.  Therapeutic Interventions: 1 to 1 sessions, Unit Group sessions and Medication administration.  Evaluation of Outcomes: Progressing   RN Treatment Plan for Primary Diagnosis: Schizoaffective disorder, bipolar type (HCC) Long Term Goal(s): Knowledge of disease and therapeutic regimen to maintain health will improve  Short Term Goals: Ability to verbalize frustration and anger appropriately will improve, Ability to demonstrate self-control, Ability to verbalize feelings will improve, Ability to identify and develop effective coping behaviors will improve and Compliance with prescribed medications will improve  Medication Management: RN will administer medications as ordered by provider, will assess and evaluate patient's response and provide education to patient for prescribed medication. RN will report any adverse and/or side effects to prescribing provider.  Therapeutic Interventions: 1 on 1 counseling sessions, Psychoeducation, Medication administration, Evaluate responses to treatment, Monitor vital signs and CBGs as ordered,  Perform/monitor CIWA, COWS, AIMS and Fall Risk screenings as ordered, Perform wound care treatments as ordered.  Evaluation of Outcomes: Progressing   LCSW Treatment Plan for Primary Diagnosis: Schizoaffective disorder, bipolar type (HCC) Long Term Goal(s): Safe transition to appropriate next level of care at discharge, Engage patient in therapeutic group addressing interpersonal concerns.  Short Term Goals: Engage patient in aftercare planning with referrals and resources, Increase social support, Increase ability to appropriately verbalize feelings, Increase emotional regulation, Identify triggers associated with mental health/substance abuse issues and Increase skills for wellness and recovery  Therapeutic Interventions: Assess for all discharge needs, 1 to 1 time with Social worker, Explore available resources and support systems, Assess for adequacy in community support network, Educate family and significant other(s) on suicide prevention, Complete Psychosocial Assessment, Interpersonal group therapy.  Evaluation of Outcomes: Progressing   Progress in Treatment: Attending groups: Yes Participating in groups: Yes Taking medication as prescribed: Yes. Toleration medication: Yes. Family/Significant other contact made: No, will contact:  supports if consent is granted Patient understands diagnosis: Yes. Discussing patient identified problems/goals with staff: No. Medical problems stabilized or resolved: Yes Denies suicidal/homicidal ideation: Yes  Issues/concerns per patient self-inventory: No.  New problem(s) identified: No, Describe:  CSW continuing to assess.   New Short Term/Long Term Goal(s): medication management for mood stabilization; elimination of SI thoughts; development of comprehensive mental wellness/sobriety plan.  Patient Goals:   Discharge Plan or Barriers: CSW continuing to assess. MHAG pamphlet, Mobile Crisis information, and AA/NA information provided to patient  for additional community support and resources.   Reason for Continuation of Hospitalization: Aggression Anxiety Mania Medication stabilization Suicidal ideation  Estimated Length of Stay: 1-3 days  Attendees: Patient: 11/04/2018   Physician: Dr. Jeannine Kitten, MD 11/04/2018   Nursing:  11/04/2018   RN Care Manager: 11/04/2018   Social Worker: Daleen Squibb, LCSW 11/04/2018   Recreational Therapist:  11/04/2018   Other:  11/04/2018   Other:  11/04/2018   Other: 11/04/2018        Scribe for Treatment Team: Lorri Frederick, LCSW 11/04/2018 11:36 AM

## 2018-11-04 NOTE — Progress Notes (Signed)
Recreation Therapy Notes  Date: 1.13.20 Time: 1000 Location: 500 Hall Dayroom  Group Topic: Coping Skills  Goal Area(s) Addresses:  Pt will identify positive coping skills. Pt will identify benefits of positive coping skills. Pt will identify benefit of using coping skills post d/c.  Behavioral Response: Minimal  Intervention: Worksheet  Activity: Mind Map.  LRT and patients filled in the first 8 boxes of the mind map together with situations (anger, depression, anxiety, accusations, fighting, isolating, relationships and mood swings) in which coping skills would be needed.  Patients were to then come up with coping skills for each situation individually.  The group would then come back together and LRT wrote coping skills on the board for patients to copy coping skills for any blank spaces on their sheets.  Education: Pharmacologist, Building control surveyor.   Education Outcome: Acknowledges understanding/In group clarification offered/Needs additional education.   Clinical Observations/Feedback: Pt was drowsy and dosing off during group.  Pt would wake up at times, participate and then fall back to sleep.  Pt woke up and gave a coping skill of "killing them with kindness" for fighting.    Caroll Rancher, LRT/CTRS     Caroll Rancher A 11/04/2018 11:25 AM
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
## 2018-11-05 MED ORDER — BENZTROPINE MESYLATE 1 MG PO TABS: 1.0000 mg | ORAL_TABLET | Freq: Two times a day (BID) | ORAL | 2 refills | Status: DC

## 2018-11-05 MED ORDER — HALOPERIDOL 5 MG PO TABS: ORAL_TABLET | ORAL | 1 refills | Status: DC

## 2018-11-05 MED ORDER — PALIPERIDONE PALMITATE ER 546 MG/1.75ML IM SUSY: 1.7500 mL | PREFILLED_SYRINGE | Freq: Once | INTRAMUSCULAR | 5 refills | Status: DC

## 2018-11-05 MED ORDER — GABAPENTIN 300 MG PO CAPS: 300.0000 mg | ORAL_CAPSULE | Freq: Three times a day (TID) | ORAL | 2 refills | Status: DC

## 2018-11-05 MED ORDER — TRAZODONE HCL 150 MG PO TABS: 150.0000 mg | ORAL_TABLET | Freq: Every day | ORAL | 2 refills | Status: DC

## 2018-11-05 MED ORDER — FLUOXETINE HCL 40 MG PO CAPS: 40.0000 mg | ORAL_CAPSULE | Freq: Every day | ORAL | 1 refills | Status: DC

## 2018-11-05 MED ORDER — DIVALPROEX SODIUM ER 500 MG PO TB24: 500.0000 mg | ORAL_TABLET | Freq: Every day | ORAL | 2 refills | Status: DC

## 2018-11-05 NOTE — BHH Suicide Risk Assessment (Signed)
[facility] Discharge Suicide Risk Assessment   Principal Problem: Schizoaffective disorder, bipolar type [facility]) Discharge Diagnoses: Principal Problem:   Schizoaffective disorder, bipolar type (HCC) Active Problems:   Schizoaffective disorder, manic type (HCC)   Total Time spent with patient: 30 minutes Currently alert and fully oriented and cooperative able to discuss his meds, no mania at present no racing thoughts no delusional believes or hallucinations no inappropriate behavior, no thoughts of harming self or others, no EPS or TD Mental Status Per Nursing Assessment::   On Admission:  NA  Demographic Factors:  Male  Loss Factors: Decrease in vocational status  Historical Factors: Impulsivity  Risk Reduction Factors:   NA  Continued Clinical Symptoms:  Schizophrenia:   Paranoid or undifferentiated type  Cognitive Features That Contribute To Risk:  None    Suicide Risk:  Minimal: No identifiable suicidal ideation.  Patients presenting with no risk factors but with morbid ruminations; may be classified as minimal risk based on the severity of the depressive symptoms  Follow-up Information    [name]. Go on 11/07/2018.   Specialty:  Behavioral Health Why:  Your hospital follow up appointment is Thursday, 1/16 at 8:00a. Please bring: photo ID, proof of insurance, social security card, and any discharge paperwork from this hospitalization.  Contact information: [address] [place] [address] [phone]           Plan Of Care/Follow-up recommendations:  Activity:  full  Samra Pesch, MD 11/05/2018, 9:27 AM
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
## 2018-11-05 NOTE — Plan of Care (Signed)
Pt was able to participate in recreation therapy group sessions with minimal impulsive behaviors.   Caroll Rancher, LRT/CTRS

## 2018-11-05 NOTE — BHH Group Notes (Signed)
[facility] LCSW Group Therapy Note  Date/Time: 11/05/18  Type of Therapy/Topic:  Group Therapy:  Feelings about Diagnosis  Participation Level:  Active   Mood:pleasant   Description of Group:    This group will allow patients to explore their thoughts and feelings about diagnoses they have received. Patients will be guided to explore their level of understanding and acceptance of these diagnoses. Facilitator will encourage patients to process their thoughts and feelings about the reactions of others to their diagnosis, and will guide patients in identifying ways to discuss their diagnosis with significant others in their lives. This group will be process-oriented, with patients participating in exploration of their own experiences as well as giving and receiving support and challenge from other group members.   Therapeutic Goals: 1. Patient will demonstrate understanding of diagnosis as evidence by identifying two or more symptoms of the disorder:  2. Patient will be able to express two feelings regarding the diagnosis 3. Patient will demonstrate ability to communicate their needs through discussion and/or role plays  Summary of Patient Progress: Pt went from making loud comments and interrupting people to dozing off to sleep several times during group.  He was attentive and engaged while awake. He tried hard to answer questions posed by CSW but sometimes his answers were unrelated to the question posed.         Therapeutic Modalities:   Cognitive Behavioral Therapy Brief Therapy Feelings Identification   Daleen Squibb, LCSW

## 2018-11-05 NOTE — Progress Notes (Signed)
  [facility] Adult Case Management Discharge Plan :  Will you be returning to the same living situation after discharge:  Yes,  with brother At discharge, do you have transportation home?: No. Bus pass provided.  Do you have the ability to pay for your medications: Yes,  medicaid  Release of information consent forms completed and in the chart;  Patient's signature needed at discharge.  Patient to Follow up at: Follow-up Information    [name]. Go on 11/07/2018.   Specialty:  Behavioral Health Why:  Your hospital follow up appointment is Thursday, 1/16 at 8:00a. Please bring: photo ID, proof of insurance, social security card, and any discharge paperwork from this hospitalization.  Contact information: [address] [place] [address] [phone]           Next level of care provider has access to [facility] Link:no  Safety Planning and Suicide Prevention discussed: No. Pt declined consent.      Has patient been referred to the Quitline?: Patient refused referral  Patient has been referred for addiction treatment: Yes  Lorri Frederick, LCSW 11/05/2018, 10:40 AM

## 2018-11-05 NOTE — Progress Notes (Signed)
Recreation Therapy Notes  INPATIENT RECREATION TR PLAN  [name] MRN: [id number] DOB: 29-Aug-1986 Today's Date: 11/05/2018  Rec Therapy Plan Is patient appropriate for Therapeutic Recreation?: Yes Treatment times per week: about 3 days Estimated Length of Stay: 5-7 days TR Treatment/Interventions: Group participation (Comment)  Discharge Criteria Pt will be discharged from therapy if:: Discharged Treatment plan/goals/alternatives discussed and agreed upon by:: Patient/family  Discharge Summary Short term goals set: See patient care plan Short term goals met: Complete Progress toward goals comments: Groups attended Which groups?: Wellness, Coping skills Reason goals not met: None Therapeutic equipment acquired: N/A Reason patient discharged from therapy: Discharge from hospital Pt/family agrees with progress & goals achieved: Yes Date patient discharged from therapy: 11/05/18    Victorino Sparrow, LRT/CTRS  Ria Comment, Katty Fretwell A 11/05/2018, 12:23 PM

## 2018-11-05 NOTE — Progress Notes (Signed)
Recreation Therapy Notes  Date: 1.14.20 Time: 1000 Location: 500 Hall Dayroom  Group Topic: Wellness  Goal Area(s) Addresses:  Patient will define components of whole wellness. Patient will verbalize benefit of whole wellness.  Behavioral Response: Engaged  Intervention:  Music  Activity:  Exercise.  LRT lead the group in series of stretches to get them loosened up to complete exercises.  Each person was then the chance to lead the group in an exercise of their choice.  Patients were allowed to take breaks and get water as they needed.  Patients were to complete at least 30 minutes of exercise.    Education: Wellness, Building control surveyorDischarge Planning.   Education Outcome: Acknowledges education/In group clarification offered/Needs additional education.   Clinical Observations/Feedback: Pt was able to stay awake the duration of group.  Pt was very hyped about doing exercises.  Pt lead group in various exercises and well as completed the exercises presented by his peers.  Pt was engaged and appropriate throughout group.   Caroll RancherMarjette Kirsten Spearing, LRT/CTRS     Lillia AbedLindsay, Matalie Romberger A 11/05/2018 11:40 AM

## 2020-04-24 IMAGING — CR DG CHEST 2V
2 series · 2 of 2 positions shown · non-contrast
Comparison: Chest radiograph and CT of the chest performed
02/18/2013

CLINICAL DATA: Acute onset of cough and congestion.

EXAM:
CHEST - 2 VIEW

[chest pa]
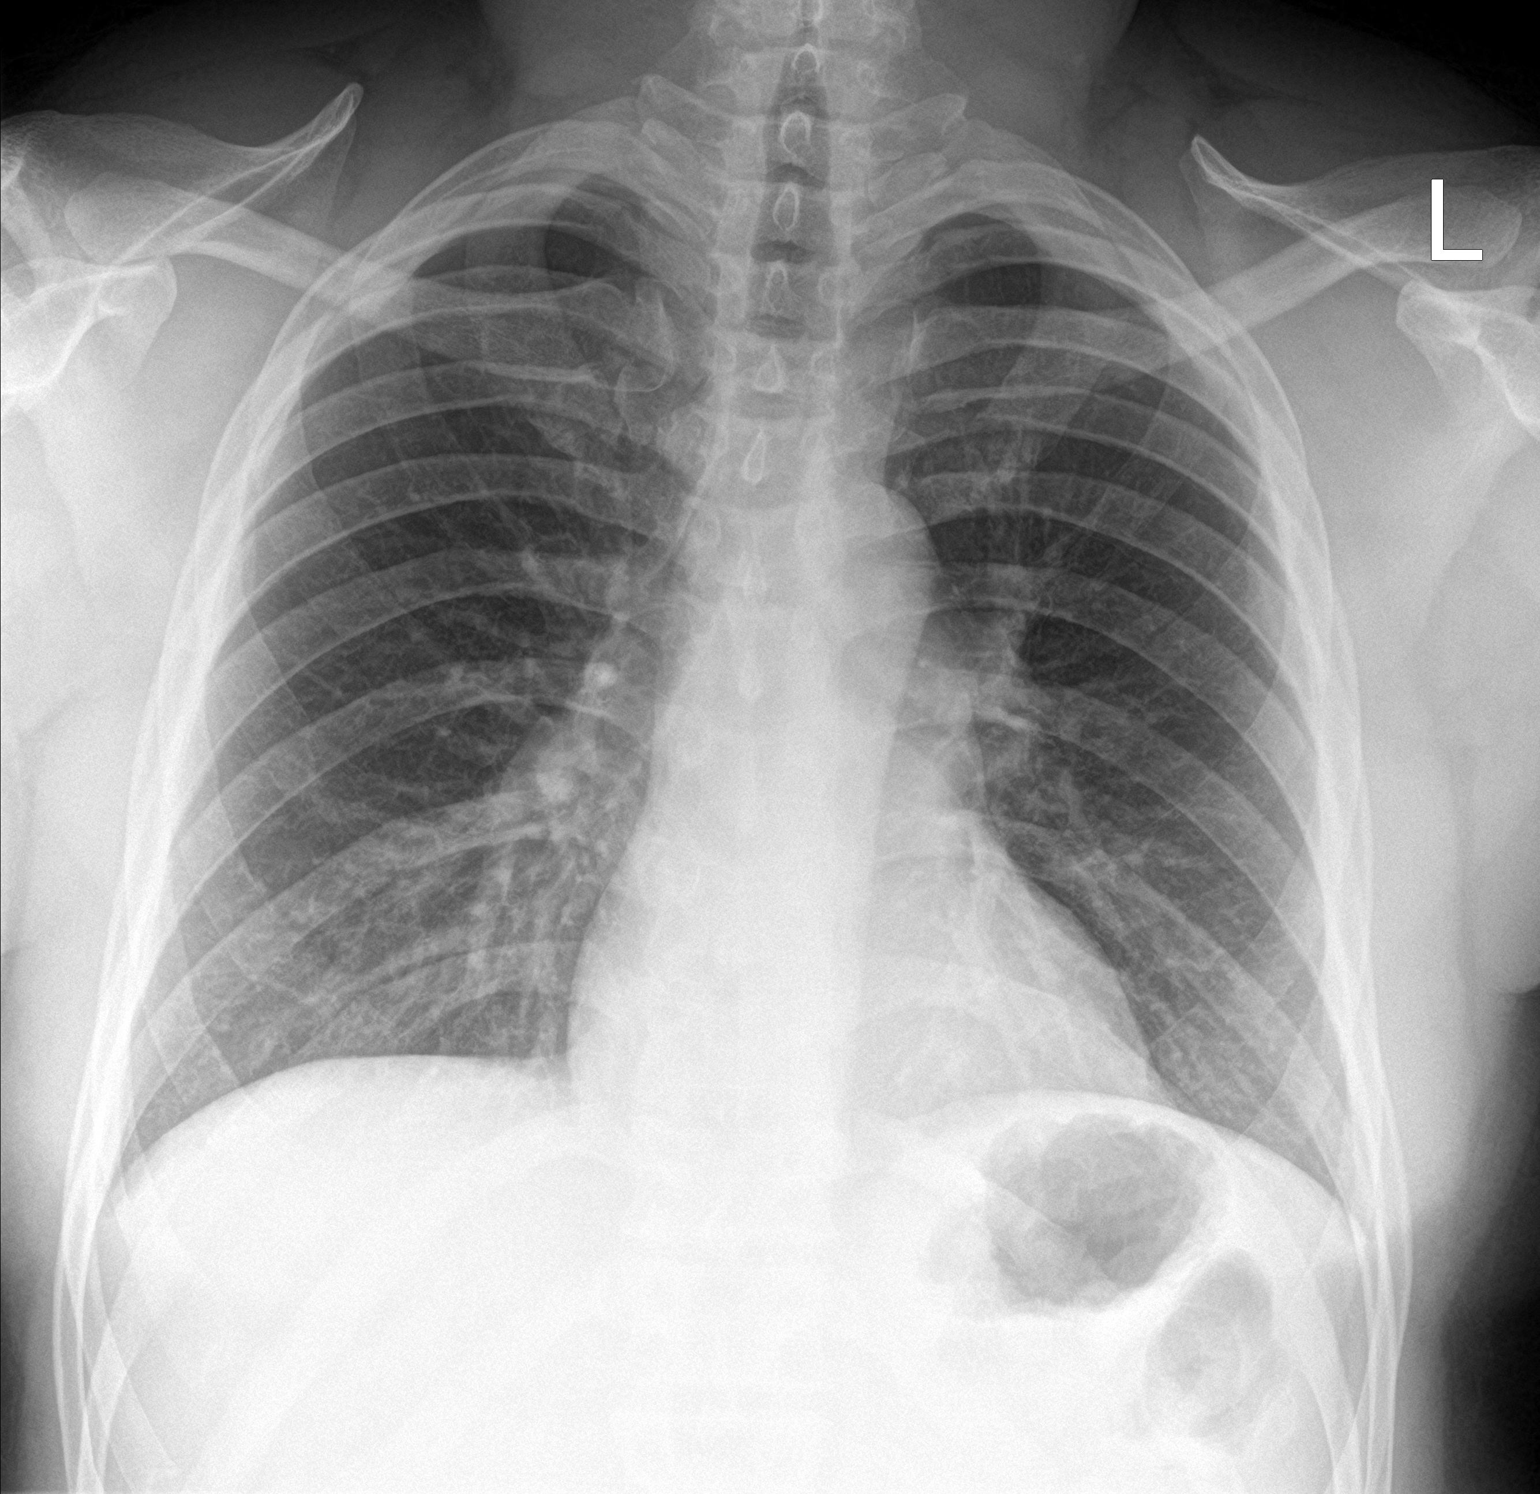

[chest lat]
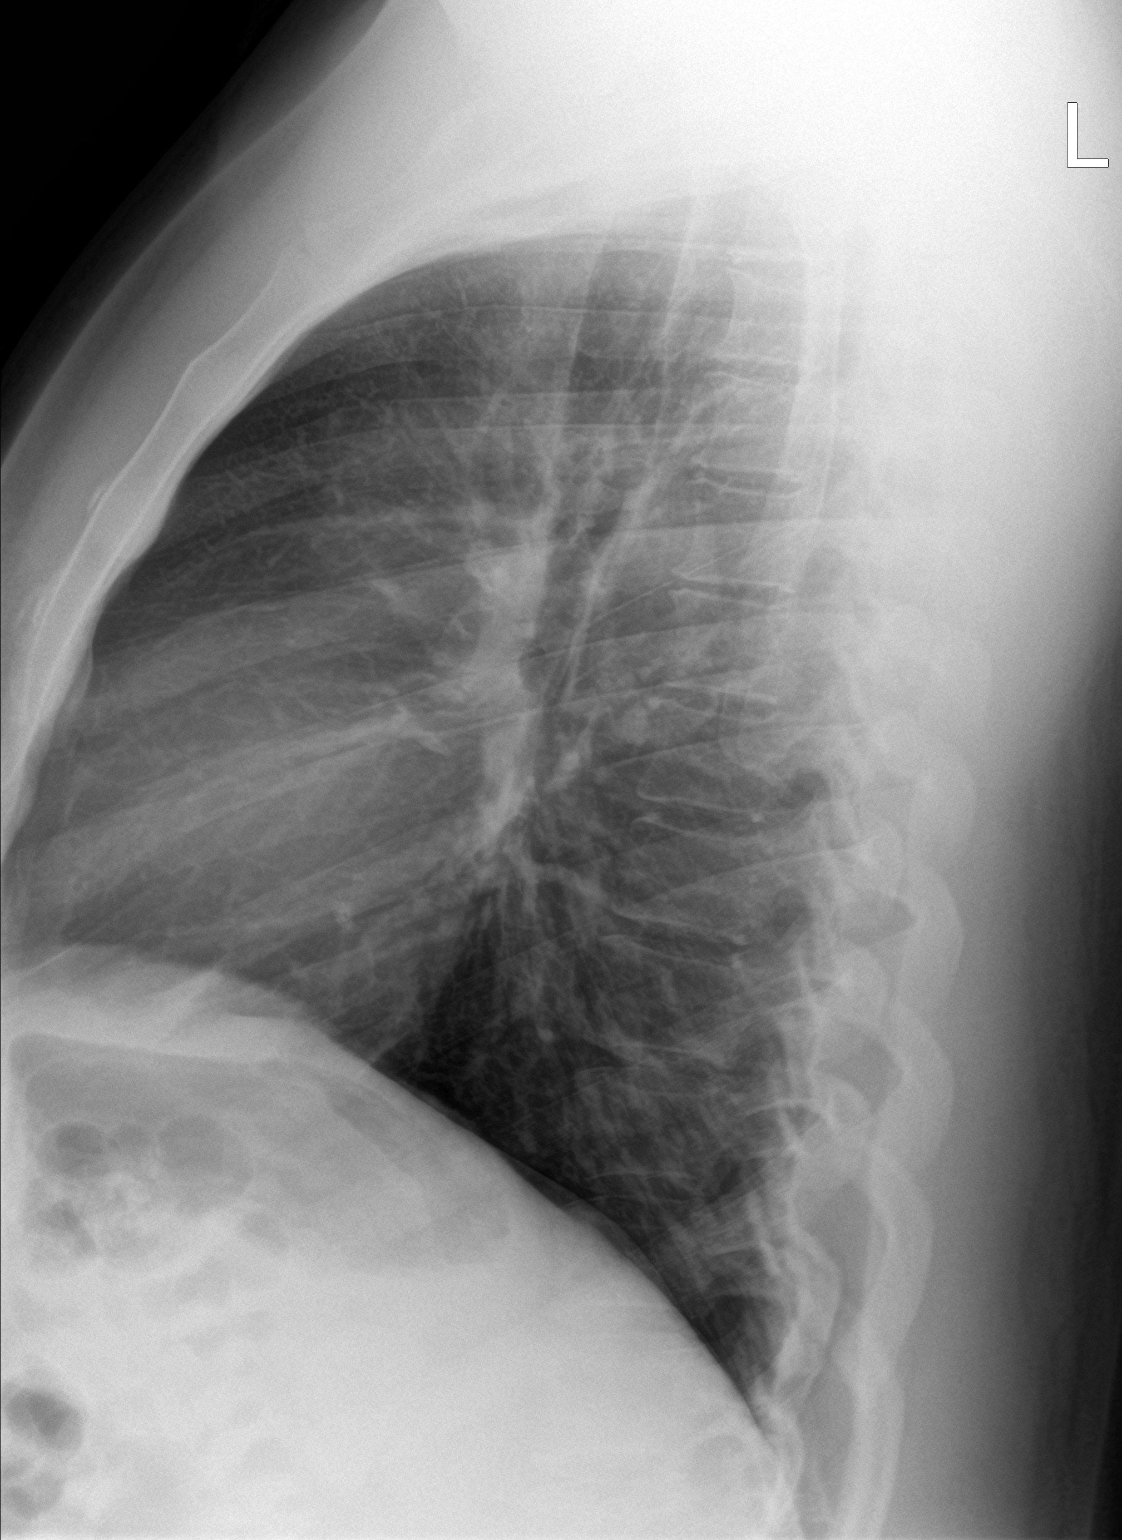

[2 of 2 positions shown; findings below may reference images not displayed]

FINDINGS: The lungs are well-aerated and clear. There is no evidence of focal
opacification, pleural effusion or pneumothorax.

The heart is normal in size; the mediastinal contour is within
normal limits. No acute osseous abnormalities are seen.
IMPRESSION: No acute cardiopulmonary process seen.

## 2020-12-18 ENCOUNTER — Other Ambulatory Visit: Payer: Self-pay

## 2020-12-18 ENCOUNTER — Ambulatory Visit (HOSPITAL_COMMUNITY)
Admission: EM | Admit: 2020-12-18 | Discharge: 2020-12-20 | Disposition: A | Discharge status: Other Acute Inpatient Hospital | Payer: Medicare Other | Attending: Psychiatry | Admitting: Psychiatry

## 2020-12-18 DIAGNOSIS — F22 Delusional disorders: Secondary | ICD-10-CM | POA: Diagnosis present

## 2020-12-18 DIAGNOSIS — Z79899 Other long term (current) drug therapy: Secondary | ICD-10-CM | POA: Insufficient documentation

## 2020-12-18 DIAGNOSIS — Z20822 Contact with and (suspected) exposure to covid-19: Secondary | ICD-10-CM | POA: Insufficient documentation

## 2020-12-18 DIAGNOSIS — I252 Old myocardial infarction: Secondary | ICD-10-CM | POA: Diagnosis not present

## 2020-12-18 DIAGNOSIS — Z7982 Long term (current) use of aspirin: Secondary | ICD-10-CM | POA: Diagnosis not present

## 2020-12-18 DIAGNOSIS — F17210 Nicotine dependence, cigarettes, uncomplicated: Secondary | ICD-10-CM | POA: Insufficient documentation

## 2020-12-18 DIAGNOSIS — Z652 Problems related to release from prison: Secondary | ICD-10-CM | POA: Diagnosis not present

## 2020-12-18 DIAGNOSIS — Z733 Stress, not elsewhere classified: Secondary | ICD-10-CM | POA: Insufficient documentation

## 2020-12-18 DIAGNOSIS — F250 Schizoaffective disorder, bipolar type: Secondary | ICD-10-CM | POA: Diagnosis not present

## 2020-12-18 LAB — POCT URINE DRUG SCREEN - MANUAL ENTRY (I-SCREEN)
POC Amphetamine UR: NOT DETECTED
POC Buprenorphine (BUP): NOT DETECTED
POC Marijuana UR: NOT DETECTED
POC Methadone UR: NOT DETECTED
POC Methamphetamine UR: NOT DETECTED
POC Morphine: NOT DETECTED
POC Oxazepam (BZO): NOT DETECTED
POC Oxycodone UR: NOT DETECTED
POC Secobarbital (BAR): NOT DETECTED

## 2020-12-18 LAB — COMPREHENSIVE METABOLIC PANEL
ALT: 31 U/L (ref 0–44)
AST: 14 U/L — ABNORMAL LOW (ref 15–41)
Albumin: 4 g/dL (ref 3.5–5.0)
Alkaline Phosphatase: 48 U/L (ref 38–126)
Anion gap: 12 (ref 5–15)
BUN: 6 mg/dL (ref 6–20)
CO2: 26 mmol/L (ref 22–32)
Calcium: 9.1 mg/dL (ref 8.9–10.3)
Chloride: 100 mmol/L (ref 98–111)
Creatinine, Ser: 0.87 mg/dL (ref 0.61–1.24)
GFR, Estimated: >60 mL/min (ref >60)
Glucose, Bld: 95 mg/dL (ref 70–99)
Potassium: 3.3 mmol/L — ABNORMAL LOW (ref 3.5–5.1)
Sodium: 138 mmol/L (ref 135–145)
Total Bilirubin: 0.3 mg/dL (ref 0.3–1.2)
Total Protein: 7.5 g/dL (ref 6.5–8.1)

## 2020-12-18 LAB — CBC WITH DIFFERENTIAL/PLATELET
Abs Immature Granulocytes: 0.02 10*3/uL (ref 0.00–0.07)
Basophils Absolute: 0 10*3/uL (ref 0.0–0.1)
Basophils Relative: 0 %
Eosinophils Absolute: 0.3 10*3/uL (ref 0.0–0.5)
Eosinophils Relative: 4 %
HCT: 48.7 % (ref 39.0–52.0)
Hemoglobin: 15.7 g/dL (ref 13.0–17.0)
Immature Granulocytes: 0 %
Lymphocytes Relative: 23 %
Lymphs Abs: 1.9 10*3/uL (ref 0.7–4.0)
MCH: 27.6 pg (ref 26.0–34.0)
MCHC: 32.2 g/dL (ref 30.0–36.0)
MCV: 85.6 fL (ref 80.0–100.0)
Monocytes Absolute: 0.8 10*3/uL (ref 0.1–1.0)
Monocytes Relative: 10 %
Neutro Abs: 5.1 10*3/uL (ref 1.7–7.7)
Neutrophils Relative %: 63 %
Platelets: 282 10*3/uL (ref 150–400)
RBC: 5.69 MIL/uL (ref 4.22–5.81)
RDW: 14.4 % (ref 11.5–15.5)
WBC: 8.1 10*3/uL (ref 4.0–10.5)
nRBC: 0 % (ref 0.0–0.2)

## 2020-12-18 LAB — TSH: TSH: 2.575 u[IU]/mL (ref 0.350–4.500)

## 2020-12-18 LAB — RESP PANEL BY RT-PCR (FLU A&B, COVID) ARPGX2
Influenza A by PCR: NEGATIVE
Influenza B by PCR: NEGATIVE
SARS Coronavirus 2 by RT PCR: NEGATIVE

## 2020-12-18 LAB — MAGNESIUM: Magnesium: 1.9 mg/dL (ref 1.7–2.4)

## 2020-12-18 LAB — HEMOGLOBIN A1C
Hgb A1c MFr Bld: 5.3 % (ref 4.8–5.6)
Mean Plasma Glucose: 105.41 mg/dL

## 2020-12-18 LAB — LIPID PANEL
Cholesterol: 163 mg/dL (ref 0–200)
HDL: 56 mg/dL (ref >40)
LDL Cholesterol: 98 mg/dL (ref 0–99)
Total CHOL/HDL Ratio: 2.9 RATIO
Triglycerides: 45 mg/dL (ref <150)
VLDL: 9 mg/dL (ref 0–40)

## 2020-12-18 LAB — POCT URINE DRUG SCREEN - MANUAL ENTRY (I-CUP): POC Cocaine UR: NOT DETECTED

## 2020-12-18 LAB — ETHANOL: Alcohol, Ethyl (B): <10 mg/dL (ref <10)

## 2020-12-18 LAB — VALPROIC ACID LEVEL: Valproic Acid Lvl: <10 ug/mL — ABNORMAL LOW (ref 50.0–100.0)

## 2020-12-18 MED ORDER — NICOTINE 14 MG/24HR TD PT24
14.0000 mg | MEDICATED_PATCH | Freq: Every day | TRANSDERMAL | Status: DC
  Administered 2020-12-19 – 2020-12-20 (×2): 14 mg via TRANSDERMAL
  Filled 2020-12-18 (×2): qty 1

## 2020-12-18 MED ORDER — ACETAMINOPHEN 325 MG PO TABS: 650.0000 mg | ORAL_TABLET | Freq: Four times a day (QID) | ORAL | Status: DC | PRN

## 2020-12-18 MED ORDER — ALUM & MAG HYDROXIDE-SIMETH 200-200-20 MG/5ML PO SUSP: 30.0000 mL | ORAL | Status: DC | PRN

## 2020-12-18 MED ORDER — HALOPERIDOL 5 MG PO TABS
5.0000 mg | ORAL_TABLET | Freq: Two times a day (BID) | ORAL | Status: DC
  Administered 2020-12-18 – 2020-12-19 (×2): 5 mg via ORAL
  Filled 2020-12-18: qty 14
  Filled 2020-12-18 (×2): qty 1

## 2020-12-18 MED ORDER — MAGNESIUM HYDROXIDE 400 MG/5ML PO SUSP: 30.0000 mL | Freq: Every day | ORAL | Status: DC | PRN

## 2020-12-18 MED ORDER — GABAPENTIN 300 MG PO CAPS
300.0000 mg | ORAL_CAPSULE | Freq: Three times a day (TID) | ORAL | Status: DC
  Administered 2020-12-18 – 2020-12-20 (×5): 300 mg via ORAL
  Filled 2020-12-18 (×5): qty 1

## 2020-12-18 MED ORDER — BENZTROPINE MESYLATE 1 MG PO TABS
1.0000 mg | ORAL_TABLET | Freq: Two times a day (BID) | ORAL | Status: DC
  Administered 2020-12-18 – 2020-12-20 (×4): 1 mg via ORAL
  Filled 2020-12-18 (×2): qty 1
  Filled 2020-12-18: qty 14
  Filled 2020-12-18 (×2): qty 1

## 2020-12-18 MED ORDER — DIVALPROEX SODIUM ER 500 MG PO TB24
500.0000 mg | ORAL_TABLET | Freq: Every day | ORAL | Status: DC
  Administered 2020-12-18 – 2020-12-20 (×3): 500 mg via ORAL
  Filled 2020-12-18: qty 1
  Filled 2020-12-18: qty 7
  Filled 2020-12-18 (×2): qty 1

## 2020-12-18 MED ORDER — TRAZODONE HCL 50 MG PO TABS
50.0000 mg | ORAL_TABLET | Freq: Every evening | ORAL | Status: DC | PRN
  Administered 2020-12-18 – 2020-12-19 (×2): 50 mg via ORAL
  Filled 2020-12-18 (×2): qty 1

## 2020-12-18 MED ORDER — HYDROXYZINE HCL 25 MG PO TABS
25.0000 mg | ORAL_TABLET | Freq: Three times a day (TID) | ORAL | Status: DC | PRN
  Administered 2020-12-18 – 2020-12-19 (×3): 25 mg via ORAL
  Filled 2020-12-18 (×3): qty 1

## 2020-12-18 MED ORDER — METOPROLOL SUCCINATE ER 50 MG PO TB24
50.0000 mg | ORAL_TABLET | Freq: Every day | ORAL | Status: DC
  Administered 2020-12-18 – 2020-12-20 (×3): 50 mg via ORAL
  Filled 2020-12-18 (×3): qty 1

## 2020-12-18 NOTE — BH Assessment (Signed)
Comprehensive Clinical Assessment (CCA) Note  12/18/2020 Jake Sanchez [id number]  Chief Complaint:  Chief Complaint  Patient presents with  . Delusional  . Schizophrenia    Patient has a history of Bi Polar and Schizophrenia . Patient was recently released from jail after two years. One day after release mother reports patient displaying bizarre behavior,confusion, and  paranoia.    Visit Diagnosis:  Schizoaffective disorder, bipolar type  Jake Sanchez is a 35 yo male presenting as a walk in to [facility] for evaluation. See provider note re: collateral concerns. Watson denies SI, HI, AVH, and paranoia at time of assessment. Pt also seemed to be responding to internal stimuli at times throughout assessment (pt kept looking over his shoulder and at walls/lights suspiciously). Pt denies any current substance use. Pt admits that he has an Investment banker, operational through [employer].  Weyman Pedro, MSW, LCSW Outpatient Therapist/Triage Specialist   Disposition: Per Berneice Heinrich, NP pt to be observed overnight [facility] with provider reassessment in the AM  CCA Screening, Triage and Referral (STR)  Patient Reported Information How did you hear about [place]? Self  Referral name: No data recorded Referral phone number: No data recorded  Whom do you see for routine medical problems? Other (Comment) (unable to access patient confused)  Practice/Facility Name: No data recorded Practice/Facility Phone Number: No data recorded Name of Contact: No data recorded Contact Number: No data recorded Contact Fax Number: No data recorded Prescriber Name: No data recorded Prescriber Address (if known): No data recorded  What Is the Reason for Your Visit/Call Today? Schizophrenia / paranoia , confusion  How Long Has This Been Causing You Problems? 1 wk - 1 month  What Do You Feel Would Help You the Most Today? Other (Comment) (inpatient)   Have You Recently Been in Any Inpatient Treatment  (Hospital/Detox/Crisis Center/28-Day Program)? No  Name/Location of Program/Hospital:No data recorded How Long Were You There? No data recorded When Were You Discharged? No data recorded  Have You Ever Received Services From [facility] Before? No  Who Do You See at [facility]? No data recorded  Have You Recently Had Any Thoughts About Hurting Yourself? No  Are You Planning to Commit Suicide/Harm Yourself At This time? No   Have you Recently Had Thoughts About Hurting Someone Karolee Ohs? No  Explanation: No data recorded  Have You Used Any Alcohol or Drugs in the Past 24 Hours? No  How Long Ago Did You Use Drugs or Alcohol? No data recorded What Did You Use and How Much? No data recorded  Do You Currently Have a Therapist/Psychiatrist? No  Name of Therapist/Psychiatrist: No data recorded  Have You Been Recently Discharged From Any Office Practice or Programs? No  Explanation of Discharge From Practice/Program: No data recorded    CCA Screening Triage Referral Assessment Type of Contact: Face-to-Face  Is this Initial or Reassessment? No data recorded Date Telepsych consult ordered in [facility]:  No data recorded Time Telepsych consult ordered in [facility]:  No data recorded  Patient Reported Information Reviewed? Yes  Patient Left Without Being Seen? No data recorded Reason for Not Completing Assessment: No data recorded  Collateral Involvement: No data recorded  Does Patient Have a Court Appointed Legal Guardian? No data recorded Name and Contact of Legal Guardian: No data recorded If Minor and Not Living with Parent(s), Who has Custody? No data recorded Is CPS involved or ever been involved? Never  Is APS involved or ever been involved? No data recorded  Patient Determined To  Be At Risk for Harm To Self or Others Based on Review of Patient Reported Information or Presenting Complaint? No  Method: No data recorded Availability of Means: No data recorded Intent: No data  recorded Notification Required: No data recorded Additional Information for Danger to Others Potential: No data recorded Additional Comments for Danger to Others Potential: No data recorded Are There Guns or Other Weapons in Your Home? No data recorded Types of Guns/Weapons: No data recorded Are These Weapons Safely Secured?                            No data recorded Who Could Verify You Are Able To Have These Secured: No data recorded Do You Have any Outstanding Charges, Pending Court Dates, Parole/Probation? No data recorded Contacted To Inform of Risk of Harm To Self or Others: No data recorded  Location of Assessment: [facility]   Does Patient Present under Involuntary Commitment? No  IVC Papers Initial File Date: No data recorded  [place] of Residence: [place]   Patient Currently Receiving the Following Services: Not Receiving Services   Determination of Need: Emergent (2 hours)   Options For Referral: Other: Comment (overnight observation with provider reassessment in AM)     CCA Biopsychosocial Intake/Chief Complaint:  Jake Sanchez is a 35 yo male presenting as a walk in to [facility] for evaluation. See provider note re: collateral concerns.  Autrey denies SI, HI, AVH, and paranoia at time of assessment. Pt also seemed to be responding to internal stimuli at times throughout assessment (pt kept looking over his shoulder and at walls/lights suspiciously). Pt denies any current substance use. Pt admits that he has an Investment banker, operational through [employer].  Current Symptoms/Problems: No data recorded  Patient Reported Schizophrenia/Schizoaffective Diagnosis in Past: Yes   Strengths: No data recorded Preferences: No data recorded Abilities: No data recorded  Type of Services Patient Feels are Needed: psychiatric stability   Initial Clinical Notes/Concerns: No data recorded  Mental Health Symptoms Depression:  Hopelessness; Worthlessness (sometimes)    Duration of Depressive symptoms: Greater than two weeks   Mania:  Racing thoughts; Recklessness   Anxiety:   Worrying   Psychosis:  Affective flattening/alogia/avolition; Grossly disorganized or catatonic behavior   Duration of Psychotic symptoms: Greater than six months   Trauma:  None   Obsessions:  None   Compulsions:  None   Inattention:  None   Hyperactivity/Impulsivity:  N/A   Oppositional/Defiant Behaviors:  None   Emotional Irregularity:  None   Other Mood/Personality Symptoms:  No data recorded   Mental Status Exam Appearance and self-care  Stature:  Average   Weight:  Average weight   Clothing:  No data recorded  Grooming:  Normal   Cosmetic use:  None   Posture/gait:  Tense   Motor activity:  Restless   Sensorium  Attention:  Distractible   Concentration:  Scattered   Orientation:  Person   Recall/memory:  -- (needs further assessment)   Affect and Mood  Affect:  Blunted; Flat   Mood:  Anxious   Relating  Eye contact:  Staring; Avoided   Facial expression:  Responsive   Attitude toward examiner:  Guarded; Resistant; Suspicious   Thought and Language  Speech flow: Blocked; Flight of Ideas   Thought content:  -- (needs further assessment)   Preoccupation:  None   Hallucinations:  None (pt denied)   Organization:  No data recorded  [employer]  Fund of Knowledge:  Fair   Intelligence:  Average   Abstraction:  Functional   Judgement:  Impaired   Reality Testing:  Variable   Insight:  Gaps   Decision Making:  Impulsive; Confused   Social Functioning  Social Maturity:  Isolates; Impulsive; Irresponsible   Social Judgement:  Heedless   Stress  Stressors:  -- (pt denies stressors)   Coping Ability:  Human resources officer Deficits:  Self-care; Self-control   Supports:  Support needed     Religion:    Leisure/Recreation: Leisure / Recreation Do You Have Hobbies?: Yes Leisure and Hobbies: "going to  bars and going to the store"  Exercise/Diet: Exercise/Diet Do You Exercise?: No Have You Gained or Lost A Significant Amount of Weight in the Past Six Months?: No Do You Follow a Special Diet?: No Do You Have Any Trouble Sleeping?: No   CCA Employment/Education Employment/Work Situation: Employment / Work Psychologist, occupational Employment situation: Unemployed Has patient ever been in the [employer]?: No  Education:     CCA Family/Childhood History Family and Relationship History:    Childhood History:  Childhood History By whom was/is the patient raised?: Mother Additional childhood history information: stable chiildhood Description of patient's relationship with caregiver when they were a child: stable Patient's description of current relationship with people who raised him/her: stable How were you disciplined when you got in trouble as a child/adolescent?: fair Does patient have siblings?: Yes Description of patient's current relationship with siblings: brother Did patient suffer any verbal/emotional/physical/sexual abuse as a child?: No Did patient suffer from severe childhood neglect?: No Has patient ever been sexually abused/assaulted/raped as an adolescent or adult?: No Was the patient ever a victim of a crime or a disaster?: No Witnessed domestic violence?: No Has patient been affected by domestic violence as an adult?: No  Child/Adolescent Assessment:     CCA Substance Use Alcohol/Drug Use: Alcohol / Drug Use Pain Medications: UTA Prescriptions: UTA Over the Counter: UTA History of alcohol / drug use?: No history of alcohol / drug abuse (pt denies substance use other than nicotine) Negative Consequences of Use:  (UTA)     ASAM's:  Six Dimensions of Multidimensional Assessment  Dimension 1:  Acute Intoxication and/or Withdrawal Potential:   Dimension 1:  Description of individual's past and current experiences of substance use and withdrawal: none  Dimension 2:   Biomedical Conditions and Complications:      Dimension 3:  Emotional, Behavioral, or Cognitive Conditions and Complications:     Dimension 4:  Readiness to Change:     Dimension 5:  Relapse, Continued use, or Continued Problem Potential:     Dimension 6:  Recovery/Living Environment:     ASAM Severity Score: ASAM's Severity Rating Score: 0  ASAM Recommended Level of Treatment:     Substance use Disorder (SUD)    Recommendations for Services/Supports/Treatments: Recommendations for Services/Supports/Treatments Recommendations For Services/Supports/Treatments: ACCTT (Assertive Community Treatment),Medication Management,Other (Comment) ([facility] overnight observation with provider reassessment in AM)  DSM5 Diagnoses: Patient Active Problem List   Diagnosis Date Noted  . Schizoaffective disorder, manic type (HCC) 10/29/2018  . Aggressive behavior   . Schizoaffective disorder, bipolar type (HCC) 03/04/2016  . Obesity 02/20/2013  . STEMI (ST elevation myocardial infarction) (HCC) 02/20/2013  . Myocardial contusion 02/19/2013  . Tobacco use 02/19/2013  . Marijuana use 02/19/2013  . History of bipolar disorder 02/19/2013  . Elevated troponin 02/18/2013   Referrals to Alternative Service(s): Referred to Alternative Service(s):   Place:   Date:  Time:    Referred to Alternative Service(s):   Place:   Date:   Time:    Referred to Alternative Service(s):   Place:   Date:   Time:    Referred to Alternative Service(s):   Place:   Date:   Time:     Ernest Haber Obinna Ehresman, LCSW

## 2020-12-18 NOTE — ED Notes (Addendum)
Pt was observed masturbating and asked to stop. He did stop for fice minutes and started back up and he was asked to stop again and he continues to do it

## 2020-12-18 NOTE — BH Assessment (Signed)
Patient brought to [facility] voluntarily by family reporting symptoms of confusion, and paranoia.  Patient was recently released from jail on 12/11/20 after serving two years. According to family patient started displaying bizarre behaviors one day after release. Patient has a history of schizophrenia and bi polar. Family is unsure if he is taking his medications. During the interview patient confused advised TTS that they date was March 32,22. Patient did not know why he was here or where he was. He stated "he was here for an appointment so he could survive in the streets".  Every question asked by interviewer patient repeated his date of birth. Patient answered no to all question but family reports bizarre behavior for past week. Patient denied SI/ HI / AVH or any substance use over pat 24 hours. Patient is emergent and will be assessed by TTS.

## 2020-12-18 NOTE — ED Notes (Signed)
Pt sleeping@this time. Breathing even and unlabored. Will continue to monitor for safety 

## 2020-12-18 NOTE — ED Notes (Addendum)
Pt admitted to continuous assessment due to delusional thoughts and schizophrenia. Pt A&O to person with bizarre behavior noted. Pt brought trash cans out of assessment rooms into hallway stating he needed to take them out. Pt has periods of confusion, asking where his shoes are even though he is wearing them. Pt is calm, cooperative, and easily redirectable. Pt tolerated skin assessment and lab work well. Pt ambulated independently to unit. Oriented to unit/staff. No signs of acute distress noted. Will continue to monitor for safety.
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
## 2020-12-19 DIAGNOSIS — F250 Schizoaffective disorder, bipolar type: Secondary | ICD-10-CM | POA: Diagnosis not present

## 2020-12-19 MED ORDER — POTASSIUM CHLORIDE CRYS ER 20 MEQ PO TBCR
40.0000 meq | EXTENDED_RELEASE_TABLET | Freq: Once | ORAL | Status: AC
  Administered 2020-12-19: 40 meq via ORAL
  Filled 2020-12-19: qty 2

## 2020-12-19 MED ORDER — HALOPERIDOL 5 MG PO TABS
10.0000 mg | ORAL_TABLET | Freq: Two times a day (BID) | ORAL | Status: DC
  Administered 2020-12-19 – 2020-12-20 (×2): 10 mg via ORAL
  Filled 2020-12-19 (×2): qty 2

## 2020-12-19 MED ORDER — ASENAPINE MALEATE 5 MG SL SUBL
5.0000 mg | SUBLINGUAL_TABLET | Freq: Two times a day (BID) | SUBLINGUAL | Status: DC | PRN
  Administered 2020-12-19: 5 mg via SUBLINGUAL
  Filled 2020-12-19: qty 1

## 2020-12-19 NOTE — ED Notes (Signed)
Melbourne Abts, PA notified of potassium level
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
## 2020-12-19 NOTE — Progress Notes (Signed)
Per Candise Bowens at [facility], pt has been accepted to [facility] for tomorrow, Monday 2/28 after 3:00PM. Accepting provider is: Dr. Haze Justin MD. Number for report: [phone]. Hillery Jacks NP and Berneice Heinrich NP have been notified of disposition plan.    Heather S. Alan Ripper, MSW, LCSW Clinical Social Worker 12/19/2020 11:03 AM

## 2020-12-19 NOTE — ED Notes (Signed)
Pt sleeping@this  time. Breathing even and unlabored. Will continue to monitor for safety

## 2020-12-19 NOTE — ED Notes (Addendum)
Pt continues to masturbate while standing up on unit. Staff has asked pt to stop multiple times. Male MHT out to talk with pt at this time. Pt continues to have difficulty processing his thoughts, has difficulty following clear directions, and easily becomes frustrated.

## 2020-12-19 NOTE — ED Notes (Signed)
Pt awake sitting on side of bed. Appropriate behavior noted at this time. Will continue to monitor for safety

## 2020-12-19 NOTE — ED Notes (Signed)
Pt pacing unit. Thoughts are scattered and confusing. Will continue to monitor pt for safety

## 2020-12-19 NOTE — ED Notes (Signed)
Pt continuous to masturbate. male MHT has went out to talk with him

## 2020-12-19 NOTE — ED Notes (Signed)
Pt given meal; sandwich, nutri-grain bar, crackers,peanut butter, coffee

## 2020-12-19 NOTE — ED Notes (Signed)
Pt is confused and cannot process his thoughts. He does not understand clear directions and becomes frustrated when he cannot process what is being told to  him. He is getting very agitated @this  point

## 2020-12-19 NOTE — ED Notes (Signed)
Pt is masturbating@this  time

## 2020-12-19 NOTE — ED Notes (Signed)
Pt is at the nursing station asking same questions. Thoughts are tangential. He is making phone calls to family members. Will continue to monitor for safety

## 2020-12-19 NOTE — ED Notes (Signed)
Given dinner

## 2020-12-19 NOTE — Progress Notes (Addendum)
Per [facility] meets criteria for inpatient treatment. CSW faxed referrals to the following facilities for review:  [facility]  TTS will continue to seek bed placement.    Trula Slade, MSW, LCSW Clinical Social Worker 12/19/2020 10:31 AM

## 2020-12-20 DIAGNOSIS — F250 Schizoaffective disorder, bipolar type: Secondary | ICD-10-CM | POA: Diagnosis not present

## 2020-12-20 LAB — PROLACTIN: Prolactin: 7.9 ng/mL (ref 4.0–15.2)

## 2020-12-20 MED ORDER — METOPROLOL SUCCINATE ER 50 MG PO TB24: 50.0000 mg | ORAL_TABLET | Freq: Every day | ORAL | Status: DC

## 2020-12-20 MED ORDER — HALOPERIDOL 10 MG PO TABS: 10.0000 mg | ORAL_TABLET | Freq: Two times a day (BID) | ORAL | Status: DC

## 2020-12-20 MED ORDER — TRAZODONE HCL 50 MG PO TABS: 50.0000 mg | ORAL_TABLET | Freq: Every evening | ORAL | Status: DC | PRN
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
## 2020-12-20 NOTE — ED Notes (Signed)
Pt awake laying prone on bed with eye contact with staff. Not endorsing SI, HI, or AVH at this time. Will continue to monitor for safety

## 2020-12-20 NOTE — ED Notes (Signed)
Pt under IVC. Escort to retrieve belongings. Ambulated per self. No s/s pain, discomfort, or acute distress. No SI, HI, or AVH noted at this time. Escorted to [place] and transferred custody to [facility] office for transportation to [facility]. Medically stable at time of d/c

## 2020-12-20 NOTE — ED Notes (Signed)
Pt pacing floor. Asking questions about the time and it being dark outside. Pt remains under continuous observation for safety

## 2020-12-20 NOTE — Discharge Instructions (Addendum)
Discharge to [facility]

## 2020-12-31 ENCOUNTER — Ambulatory Visit (HOSPITAL_COMMUNITY): Payer: Medicaid Other | Admitting: Physician Assistant

## 2022-06-22 ENCOUNTER — Telehealth: Payer: Self-pay | Admitting: *Deleted

## 2022-06-22 NOTE — Patient Outreach (Signed)
  Care Coordination   06/22/2022 Name: Jake Sanchez MRN: [id number] DOB: [birth year]   Care Coordination Outreach Attempts:  An unsuccessful telephone outreach was attempted today to offer the patient information about available care coordination services as a benefit of their health plan.   Follow Up Plan:  Additional outreach attempts will be made to offer the patient care coordination information and services.   Encounter Outcome:  No Answer  Care Coordination Interventions Activated:  No   Care Coordination Interventions:  No, not indicated    Irving Shows [facility], BSN [facility] RN Care Coordinator [phone]

## 2022-06-27 ENCOUNTER — Telehealth: Payer: Self-pay | Admitting: *Deleted

## 2022-06-27 NOTE — Patient Outreach (Signed)
  Care Coordination   06/27/2022 Name: Jake Sanchez MRN: [id number] DOB: Aug 03, 1986   Care Coordination Outreach Attempts:  A second unsuccessful outreach was attempted today to offer the patient with information about available care coordination services as a benefit of their health plan.     Follow Up Plan:  Additional outreach attempts will be made to offer the patient care coordination information and services.   Encounter Outcome:  No Answer  Care Coordination Interventions Activated:  No   Care Coordination Interventions:  No, not indicated    Irving Shows [facility], BSN [facility] RN Care Coordinator [phone]

## 2022-06-28 ENCOUNTER — Telehealth: Payer: Self-pay | Admitting: *Deleted

## 2022-06-28 NOTE — Patient Outreach (Signed)
  Care Coordination   06/28/2022 Name: Jake Sanchez MRN: [id number] DOB: 01-20-1986   Care Coordination Outreach Attempts:  A third unsuccessful outreach was attempted today to offer the patient with information about available care coordination services as a benefit of their health plan.   Follow Up Plan:  No further outreach attempts will be made at this time. We have been unable to contact the patient to offer or enroll patient in care coordination services  Encounter Outcome:  No Answer  Care Coordination Interventions Activated:  No   Care Coordination Interventions:  No, not indicated    Irving Shows [facility], BSN [facility] RN Care Coordinator [phone]

## 2023-11-13 ENCOUNTER — Ambulatory Visit (HOSPITAL_COMMUNITY)
Admission: EM | Admit: 2023-11-13 | Discharge: 2023-11-13 | Disposition: A | Discharge status: Home / Self Care | Payer: Medicare Other | Attending: Nurse Practitioner | Admitting: Nurse Practitioner

## 2023-11-13 DIAGNOSIS — F250 Schizoaffective disorder, bipolar type: Secondary | ICD-10-CM

## 2023-11-13 DIAGNOSIS — Z760 Encounter for issue of repeat prescription: Secondary | ICD-10-CM | POA: Diagnosis present

## 2023-11-13 MED ORDER — HALOPERIDOL DECANOATE 100 MG/ML IM SOLN: 100.0000 mg | Freq: Once | INTRAMUSCULAR | Status: DC

## 2023-11-13 MED ORDER — HALOPERIDOL DECANOATE 100 MG/ML IM SOLN
100.0000 mg | Freq: Once | INTRAMUSCULAR | Status: AC
  Administered 2023-11-13: 100 mg via INTRAMUSCULAR
  Filled 2023-11-13: qty 1

## 2023-11-13 NOTE — ED Notes (Signed)
Pt given Haldol Dec injection to R deltoid without difficulty. Writer reviewed instructions on AVS with pt and provided pt copy. Pt verbalized understanding and agreement. Safety  maintained.

## 2023-11-13 NOTE — ED Provider Notes (Signed)
Behavioral Health Urgent Care Medical Screening Exam  Patient Name: Jake Sanchez MRN: [id number] Date of Evaluation: 11/13/23 Chief Complaint:  haldol dec injection Diagnosis:  Final diagnoses:  Encounter for medication refill    History of Present illness: Jake Sanchez is a 38 y.o. male patient presented to [facility] as a walk in voluntarily accompanied by himself with complaints of wanting his haldol dec injection he is due for.   Jake Sanchez, 38 y.o., male patient seen face to face by this provider, consulted with Dr. Woodroe Mode; and chart reviewed on 11/13/23.  On evaluation Jake Sanchez reports that he has been doing well since he was released from [employer] in [place] on December 27th. He has been taking Haldol Decanoate, and reports his last injection was on December 24th, he is unsure of the dose. Pt is requesting his injection today, as he has not established outpatient care yet. Pt denies any suicidal or homicidal ideations. He denies auditory or visual hallucinations. Denies any paranoia. He has been staying at [employer], and is currently looking for employment. Denies any illicit substance use or alcohol use. He is originally from [place] so he is aware of his community resources, and still has supportive family members in the area.   I spoke with the nurse at [facility] in [place], and was able to confirm he last received Haldol Decanoate 100 mg on 10/16/23.   Will order for patient to receive Haldol Dec 100 mg IM today. Instructed patient about [facility] OP walk in hours to establish OP care. He stated he would try and go this week. Pt is appropriate for discharge at this time.   During evaluation Jake Sanchez is sitting in no acute distress.  He is alert, oriented x 4, calm, cooperative and attentive.  His mood is euthymic with congruent affect.  He has normal speech, and behavior.  Objectively there is no evidence of psychosis/mania or  delusional thinking.  Patient is able to converse coherently, goal directed thoughts, no distractibility, or pre-occupation. He also denies suicidal/self-harm/homicidal ideation, psychosis, and paranoia.  Patient answered question appropriately.     Flowsheet Row ED from 11/13/2023 in [facility] ED from 10/26/2018 in [facility] Emergency Department at [facility]  C-SSRS RISK CATEGORY No Risk High Risk       Psychiatric Specialty Exam  Presentation  General Appearance:Appropriate for Environment  Eye Contact:Good  Speech:Clear and Coherent  Speech Volume:Normal  Handedness:No data recorded  Mood and Affect  Mood: Euthymic  Affect: Congruent   Thought Process  Thought Processes: Coherent  Descriptions of Associations:Intact  Orientation:Full (Time, Place and Person)  Thought Content:Logical  Diagnosis of Schizophrenia or Schizoaffective disorder in past: No data recorded Duration of Psychotic Symptoms: No data recorded Hallucinations:None  Ideas of Reference:None  Suicidal Thoughts:No  Homicidal Thoughts:No   Sensorium  Memory: Immediate Good; Recent Good  Judgment: Good  Insight: Good   Executive Functions  Concentration: Good  Attention Span: Good  Recall: Good  Fund of Knowledge: Good  Language: Good   Psychomotor Activity  Psychomotor Activity: Normal   Assets  Assets: Communication Skills; Desire for Improvement; Social Support; Physical Health   Sleep  Sleep: Good  Number of hours: No data recorded  Physical Exam: Physical Exam Vitals and nursing note reviewed.  Neurological:     Mental Status: He is alert and oriented to person, place, and time.    Review of Systems  Psychiatric/Behavioral: Negative.  All other systems reviewed and are negative.  Blood pressure (!) 141/88, pulse 99, temperature 98.6 F (37 C), temperature source Oral, resp. rate 17, SpO2 99%. There is no  height or weight on file to calculate BMI.  Musculoskeletal: Strength & Muscle Tone: within normal limits Gait & Station: normal Patient leans: N/A   [facility] MSE Discharge Disposition for Follow up and Recommendations: Based on my evaluation the patient does not appear to have an emergency medical condition and can be discharged with resources and follow up care in outpatient services for Medication Management  - Patient to receive Haldol Dec 100 mg IM today, 11/13/23, prior to discharge.  - Resources provided in AVS   Eligha Bridegroom, NP 11/13/2023, 9:58 AM

## 2023-11-13 NOTE — Progress Notes (Signed)
   11/13/23 0814  [facility] Triage Screening (Walk-ins at [facility] only)  How Did You Hear About [place]? Self  What Is the Reason for Your Visit/Call Today? Sincere Tippery presents to [facility] voluntarily unaccompanied. Pt states that he got out of prison on Dec. 26 2024 and he needs to get his monthly Haldol injection. Pt states that he has bipolar. Pt denies SI, HI, AVH and alcohol/drug use at this time.  How Long Has This Been Causing You Problems? 1 wk - 1 month  Have You Recently Had Any Thoughts About Hurting Yourself? No  Are You Planning to Commit Suicide/Harm Yourself At This time? No  Have you Recently Had Thoughts About Hurting Someone Karolee Ohs? No  Are You Planning To Harm Someone At This Time? No  Physical Abuse Denies  Verbal Abuse Denies  Sexual Abuse Denies  Exploitation of patient/patient's resources Denies  Self-Neglect Denies  Are you currently experiencing any auditory, visual or other hallucinations? No  Have You Used Any Alcohol or Drugs in the Past 24 Hours? No  Do you have any current medical co-morbidities that require immediate attention? No  Clinician description of patient physical appearance/behavior: neatly dressed, calm, cooperative  What Do You Feel Would Help You the Most Today? Social Support;Medication(s)  If access to [facility] was not available, would you have sought care in the Emergency Department? No  Determination of Need Routine (7 days)  Options For Referral Medication Management

## 2023-11-13 NOTE — Discharge Instructions (Signed)
Behavioral Health Resources:  Intensive Outpatient Programs: [facility]      [address] [place], [place] [phone] Both a day and evening program       [facility] Outpatient     [address]        [place], [place] [address] [phone]         ADS: [facility] [address] [place] [phone]  [facility] Mental Health ACCESS LINE: [phone] or [phone] [address] [place], [place] [address] [email or web address]   Substance Abuse Resources: [facility]  [phone] [facility] [phone] [facility] [phone] Floydene Flock [phone] Residential & Outpatient Substance Abuse Program  [phone]  Psychological Services: [facility]  [phone] [facility]  [phone] [facility], [phone] [place]. [address], [place], ACCESS LINE: [phone] or [phone], [email or web address]  Mobile Crisis Teams:                                        Therapeutic Alternatives         Mobile Crisis Care Unit [phone]             [facility] [address] Ginette Otto [phone]                                         Interventionist [address] [place] [place] [phone]  Self-Help/Support Groups: Mental Health Assoc. of [employer] of support groups [phone] (call for more info)  Narcotics Anonymous (NA) Caring Services [address] [place] - 2 meetings at this location  Residential Treatment Programs:  [facility]      [address]        [place]       [phone]         [facility] [address], [place] [address] [place], [place]  [address] [phone]  [facility]  [address] [place], [place]  [address] [phone] Admissions: 8am-3pm M-F  [facility]     [address]        [place], [place] [address]       [phone]         [facility] [address] [place], [place] [phone]  [facility] [address] [place], [place] [phone]  [facility] - Intensive Outpatient      [address]     [place], [place]       [phone]         [facility] ([facility]     [address] [place], [place] [phone] or [phone]  Residential Treatment Services (RTS), Medicaid [address] [place], [place] [phone]  [facility] [address]                                               [address] [place] [phone]  [facility] Resources: Estate manager/land agent [phone]               General Therapy  Angie Fava, PhD        [address] [place], [place] [address]         [phone]   Insurance  [facility]   [address] [place], [place] [address] [phone]  [facility] [address] [place], [place] [address] [phone] Insurance/Medicaid/sponsorship through [facility] and Families                                              [address]                                        [place], [place] [address]    Therapy/tele-psych/case         [phone]          [facility] [address], [place]  [address]  Adolescent/group home/case management [phone]                                           Creola Corn PhD       General therapy       Insurance   [phone]         Dr. Lolly Mustache, Insurance, M-F [phone]  [facility]  [employer] [facility] Dept. [address]                 [address]         [address] [place] [address]  Blondell Reveal Phone:  [phone]                                  Phone:  [phone]                   Phone:  [phone]  [facility], [phone] [facility]- [phone]       -     [facility] in [place], [address]             [phone], InsuranceWhile we are unable to provide direct housing, financial, or social services, [employer] cares deeply for this community. We would be happy to connect you with other organizations in our community that may be able to provide assistance. We invite you to connect with [place] for spiritual support and prayer during difficult times. Our hearts are  with you, and we hope these resources can help point you in a positive direction. Please reach out if we can provide additional support.  [facility] Address: [address], [place], [place] [address] Phone: [phone] Website: View Here Hours: 9am-4 pm. (closed 12:30 for lunch)  Description: You can come to [place] for help with your resume and cover letter. We help instruct individuals on how to apply for jobs online and give tips on how to prepare for interviews. We host hiring events every week and our helpful staff can work with you on a one-on-one basis.  [employer] Address: [address], [place], [place] [address] Phone: [phone] Website: View Here Hours: M,T,TH: 9:00am-4:00pm W: 9:00am-6:00pm F: 9:00am-2:00pm  Description: [employer] is Statistician. Job seekers can Financial controller for jobs, create resumes, and find education and training. Employers can find candidates, post jobs, and Sales executive.  [employer] Address: [address] [place], [place], [place] [address] Phone: [phone] Website: View Here Hours: M-F: 8:30am-5pm  Description: [employer] is a 60 (c)(3)  organization that provides free job readiness training, active mentoring, and supportive services to help individuals find and keep jobs and build stable lives.  [employer] Address: [address], [place], [place] [address] Phone: [phone] Website: View Here Hours: M-F: 8:30am-5pm  Description: Employment program for formerly incarcerated Field seismologist Location: Address: [address] [phone]  Phone: [phone] Website: View Here Hours: Unknown  [place] Location: Address: [address], Suite [place] [address] Phone: [phone] Website: View Here Hours: Unknown  Description: Support of employment for individuals with a disabilityWhile we are unable to provide direct housing, financial, or social services, [employer] cares deeply for this community. We would be happy to connect you with other organizations in our community that may be able to provide assistance. We invite you to connect with [place] for spiritual support and prayer during difficult times. Our hearts are with you, and we hope these resources can help point you in a positive direction. Please reach out if we can provide additional support.  [facility] Address: [address], [place], [place] [address] Phone: [phone] Website: View Here Hours: 9am-4 pm. (closed 12:30 for lunch)  Description: You can come to [place] for help with your resume and cover letter. We help instruct individuals on how to apply for jobs online and give tips on how to prepare for interviews. We host hiring events every week and our helpful staff can work with you on a one-on-one basis.  [employer] Address: [address], [place], [place] [address] Phone: [phone] Website: View Here Hours: M,T,TH: 9:00am-4:00pm W: 9:00am-6:00pm F: 9:00am-2:00pm  Description: [employer] is Statistician. Job seekers can Financial controller for  jobs, create resumes, and find education and training. Employers can find candidates, post jobs, and Sales executive.  [employer] Address: [address] [place], [place], [place] [address] Phone: [phone] Website: View Here Hours: M-F: 8:30am-5pm  Description: [employer] is a 67 (c)(3) organization that provides free job readiness training, active mentoring, and supportive services to help individuals find and keep jobs and build stable lives.  [employer] Address: [address] [place], [place] [address] Phone: [phone] Website: View Here Hours: M-F: 8:30am-5pm  Description: Employment program for formerly incarcerated individuals  [employer] Location: Address:  [address] [address]  Phone: [phone] Website: View Here Hours: Unknown  [place] Location: Address: [address] Suite [place] [address] Phone: [phone] Website: View Here Hours: Unknown  Description: Support of employment for individuals with a disability

## 2023-11-29 ENCOUNTER — Other Ambulatory Visit: Payer: Self-pay | Admitting: Critical Care Medicine

## 2023-11-29 ENCOUNTER — Encounter: Payer: Self-pay | Admitting: Physician Assistant

## 2023-11-29 ENCOUNTER — Other Ambulatory Visit: Payer: Self-pay

## 2023-11-29 MED ORDER — LOSARTAN POTASSIUM 25 MG PO TABS
25.0000 mg | ORAL_TABLET | Freq: Every day | ORAL | 0 refills | Status: DC
  Filled 2023-11-29: qty 30, 30d supply, fill #0

## 2023-11-29 NOTE — Progress Notes (Signed)
 losartan

## 2023-11-29 NOTE — Progress Notes (Addendum)
 Pt concerned because his BP was high when checked.   He was on BP and DM meds per his report.   In records, he has rx for metop XL 50 mg every day from 2022, that is the only one in the system.  Says the meds were given to him when he was in prison.  Since release, his BP has been high  11/13/2023  141/88, 99, 99%   BP Readings from Last 3 Encounters:  11/29/23 (!) 148/97  10/27/18 (!) 142/79  10/09/16 121/81    Shona Shad, PA-C 11/29/2023 11:06 AM

## 2023-11-30 ENCOUNTER — Encounter: Payer: Self-pay | Admitting: *Deleted

## 2023-11-30 NOTE — Congregational Nurse Program (Signed)
  Dept: [phone]   Congregational Nurse Program Note  Date of Encounter: 11/30/2023  Past Medical History: Past Medical History:  Diagnosis Date   Arthritis    probably in my knees (02/19/2013)   Bipolar disorder (HCC)    Chronic mid back pain    Obesity 02/20/2013   Schizo affective schizophrenia (HCC)    Shortness of breath    can happen at any time (02/19/2013)   STEMI (ST elevation myocardial infarction) (HCC) 02/20/2013    Encounter Details:  Community Questionnaire - 11/29/23 1311       Questionnaire   Ask client: Do you give verbal consent for me to treat you today? Yes    Student Assistance [employer] Nurse    Location Patient Served  [facility]    Encounter Setting CN site    Population Status Unhoused    Insurance Medicaid;Medicare    Insurance/Financial Assistance Referral N/A    Medication Have Medication Insecurities;Provided Medication Assistance    Medical Provider Yes    Screening Referrals Made N/A    Medical Referrals Made [facility]    Medical Appointment Completed [facility]    CNP Interventions Advocate/Support;Case Management    Screenings CN Performed N/A    ED Visit Averted N/A    Life-Saving Intervention Made N/A           Client signed to see MD in [facility] clinic. Received message for medication pick up and client would need medication by 2/11. Picked up blood pressure medication at [facility] pharmacy and [facility] to [facility]. Could not locate client. Left medication in secure area [facility] clinic. Will try to locate client again on Monday. Tried to locate client Monday and then called number on file. Per client place medication in white bag with blue dots with his last name on it in the medication cabinet. Done as requested. Requested client f/u with CN nurse for blood pressure recheck when available. Roshni Burbano W RN CN

## 2023-12-14 ENCOUNTER — Ambulatory Visit (HOSPITAL_COMMUNITY)
Admission: EM | Admit: 2023-12-14 | Discharge: 2023-12-14 | Disposition: A | Discharge status: Home / Self Care | Payer: Medicare Other | Attending: Nurse Practitioner | Admitting: Nurse Practitioner

## 2023-12-14 ENCOUNTER — Other Ambulatory Visit (HOSPITAL_COMMUNITY): Payer: Self-pay | Admitting: Psychiatry

## 2023-12-14 DIAGNOSIS — F250 Schizoaffective disorder, bipolar type: Secondary | ICD-10-CM | POA: Diagnosis present

## 2023-12-14 MED ORDER — HALOPERIDOL DECANOATE 100 MG/ML IM SOLN: 100.0000 mg | INTRAMUSCULAR | 11 refills | Status: DC

## 2023-12-14 NOTE — ED Provider Notes (Addendum)
 Behavioral Health Urgent Care Medical Screening Exam  Patient Name: Jake Sanchez MRN: [id number] Date of Evaluation: 12/14/23 Chief Complaint: Needing long acting injection  Diagnosis:  Final diagnoses:  Schizoaffective disorder, bipolar type (HCC)   History of Present illness: Jake Sanchez is a 38 y.o. male AAM with a prior mental health diagnosis of Schizoaffective d/o bipolar type, who presented to this urgent care center today, 12/14/2023, asking for his second LAI Haldol Decanoate 100 mg. Prior to presenting to the urgent care, he had presented to the second floor clinic requesting same, and instructions had been given for him to come back to clinic to establish care on 02/24 @ 0800 to be seen by Ms. Doyne Keel for a medication management appointment, and that the injection will be given to him on the same date. Pt previously received the LAI here last month, but did not follow directions to present to the second floor to establish care for continuity of care for medication management. The above information reiterated to patient as below: Last received injection was on 11[facility]13/2023 at [facility]. Patient informed by Provider that he needed to be seen by provider (Ms Doyne Keel) prior to getting his next injection on 12/17/2023. He is educated to come back here on 2/24 to receive his Haldol Decanoate 100 mg. Patient medication sent to his preferred pharmacy. He has been educated to pick up the medication at pharmacy, and bring it here on 2/24. Pharmacy is: [facility] [phone] - [place] - [address]  Patient has been educated on the importance of establishing care and seeing his MH provider on an ongoing basis rather than just for his monthly injections. He has been educated on the need for the lipid panel as well as his TSH, hemoglobin A1c and EKG to be monitored while on this medication, and on the rationales why. Patient verbalized  understanding and stated that he will be back on 02/24 for the med management appt and for the LAI.   Pt presented with a euthymic mood, attention to personal hygiene and grooming is fair, eye contact is good, speech is clear & coherent. Thought contents are organized and logical, and pt currently denies SI/HI/AVH or paranoia. There is no evidence of delusional thoughts. No overt signs/symptoms of psychosis noted.  Discussed safety plan should he need it: Calling [phone] or 911/EMS or going the [facility] behavioral health urgent care center or to the nearest emergency room for any worsening of condition. Denies having any concerns. Verbalized understanding prior to discharge.   Flowsheet Row ED from 11/13/2023 in [facility] ED from 10/26/2018 in [facility] at [facility]  C-SSRS RISK CATEGORY No Risk High Risk      Psychiatric Specialty Exam  Presentation  General Appearance:Appropriate for Environment; Fairly Groomed  Eye Contact:Fair  Speech:Clear and Coherent  Speech Volume:Normal  Handedness:Right   Mood and Affect  Mood: Euthymic  Affect: Congruent   Thought Process  Thought Processes: Coherent  Descriptions of Associations:Intact  Orientation:Full (Time, Place and Person)  Thought Content:Logical  Diagnosis of Schizophrenia or Schizoaffective disorder in past: No data recorded Duration of Psychotic Symptoms: No data recorded Hallucinations:None  Ideas of Reference:None  Suicidal Thoughts:No  Homicidal Thoughts:No   Sensorium  Memory: Immediate Good  Judgment: Good  Insight: Good   Executive Functions  Concentration: Good  Attention Span: Good  Recall: Good  Fund of Knowledge: Good  Language:  Good   Psychomotor Activity  Psychomotor Activity: Normal   Assets  Assets: Communication Skills   Sleep  Sleep: Good  Number of hours: No data recorded  Physical Exam: Physical  Exam Vitals and nursing note reviewed.  Neurological:     General: No focal deficit present.     Mental Status: He is oriented to person, place, and time.    Review of Systems  Psychiatric/Behavioral:  Negative for depression, hallucinations, memory loss, substance abuse and suicidal ideas. The patient is not nervous/anxious and does not have insomnia.   All other systems reviewed and are negative.  Blood pressure (!) 157/63, pulse 69, resp. rate 20, SpO2 100%. There is no height or weight on file to calculate BMI.-Educated on the need to follow up regarding elevated BP with his PCP.  Musculoskeletal: Strength & Muscle Tone: within normal limits Gait & Station: normal Patient leans: N/A  [facility] MSE Discharge Disposition for Follow up and Recommendations: Based on my evaluation the patient does not appear to have an emergency medical condition and can be discharged with resources and follow up care in outpatient services for Medication Management  Patient informed by Provider that he needed to be seen by provider (Ms Doyne Keel) prior to getting his next injection on 12/17/2023. He is educated to come back here on 2/24 to receive his Haldol Decanoate 100 mg.  Starleen Blue, NP 12/14/2023, 11:22 AM

## 2023-12-14 NOTE — Discharge Instructions (Addendum)
 last received injection was on [facility] at [facility]. Patient informed by Provider that he needed to be seen by provider (Ms Doyne Keel) prior to getting his next injection on 12/17/2023. He is educated to come back here on 2/24 to receive his Haldol Decanoate 100 mg. Patient medication sent to his preferred pharmacy. He has been educated to pick up the medication at pharmacy, and bring it here on 2/24. Pharmacy is: [facility] [phone] - , [place] - [address]:

## 2023-12-14 NOTE — Telephone Encounter (Signed)
 Patient presented to the clinic today (12/14/2023) reporting that he is in need of his haldol injection. Patient last received his injection on 11/13/2023 at [facility]. Patient informed that he needed to be seen by writer prior to getting his next injection. Patient will come on 12/17/2023 to receive his Haldol Decanoate 100 mg. Patient medication sent to his preferred pharmacy. No other concerns noted at this time.

## 2023-12-18 ENCOUNTER — Ambulatory Visit (INDEPENDENT_AMBULATORY_CARE_PROVIDER_SITE_OTHER): Payer: Medicare Other | Admitting: Psychiatry

## 2023-12-18 ENCOUNTER — Ambulatory Visit (INDEPENDENT_AMBULATORY_CARE_PROVIDER_SITE_OTHER): Payer: Medicare Other

## 2023-12-18 ENCOUNTER — Encounter (HOSPITAL_COMMUNITY): Payer: Self-pay

## 2023-12-18 VITALS — BP 147/91 | HR 78 | Ht 67.0 in | Wt 240.0 lb

## 2023-12-18 DIAGNOSIS — F250 Schizoaffective disorder, bipolar type: Secondary | ICD-10-CM | POA: Diagnosis not present

## 2023-12-18 DIAGNOSIS — Z720 Tobacco use: Secondary | ICD-10-CM

## 2023-12-18 MED ORDER — HALOPERIDOL DECANOATE 100 MG/ML IM SOLN
100.0000 mg | Freq: Once | INTRAMUSCULAR | Status: AC
  Administered 2023-12-18: 100 mg via INTRAMUSCULAR

## 2023-12-18 MED ORDER — HALOPERIDOL DECANOATE 100 MG/ML IM SOLN: 100.0000 mg | INTRAMUSCULAR | 11 refills | Status: DC

## 2023-12-18 NOTE — Addendum Note (Signed)
 Addended by: Shanna Cisco on: 12/18/2023 10:49 AM   Modules accepted: Orders
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
## 2023-12-18 NOTE — Progress Notes (Cosign Needed Addendum)
 Pt presents today for injection of 1 Haldol mL (100mg ). Pt tolerated injection well with no complaints.Injection was tolerated in left deltoid.    Pt seems to be in better spirits for today's visit. Pt states that he is living is a shelter home and has a PCP there who is actively monitoring his BP. Pt Denies any AVH, SI, and HI.

## 2023-12-19 ENCOUNTER — Other Ambulatory Visit (HOSPITAL_COMMUNITY): Payer: Self-pay | Admitting: Psychiatry

## 2023-12-19 ENCOUNTER — Other Ambulatory Visit: Payer: Self-pay

## 2023-12-19 DIAGNOSIS — F250 Schizoaffective disorder, bipolar type: Secondary | ICD-10-CM

## 2023-12-19 MED ORDER — HALOPERIDOL DECANOATE 100 MG/ML IM SOLN: 100.0000 mg | INTRAMUSCULAR | 11 refills | Status: DC

## 2023-12-26 ENCOUNTER — Other Ambulatory Visit (INDEPENDENT_AMBULATORY_CARE_PROVIDER_SITE_OTHER): Payer: Medicare Other

## 2023-12-26 DIAGNOSIS — F250 Schizoaffective disorder, bipolar type: Secondary | ICD-10-CM

## 2023-12-26 DIAGNOSIS — Z79899 Other long term (current) drug therapy: Secondary | ICD-10-CM | POA: Diagnosis not present

## 2023-12-26 NOTE — Progress Notes (Signed)
 Pt tolerated lab draws well in Right hand with no complaints.  CM, JNL

## 2023-12-27 ENCOUNTER — Other Ambulatory Visit (HOSPITAL_COMMUNITY): Payer: Self-pay | Admitting: Psychiatry

## 2023-12-27 DIAGNOSIS — Z0000 Encounter for general adult medical examination without abnormal findings: Secondary | ICD-10-CM

## 2023-12-27 DIAGNOSIS — Z113 Encounter for screening for infections with a predominantly sexual mode of transmission: Secondary | ICD-10-CM

## 2023-12-27 LAB — COMPREHENSIVE METABOLIC PANEL
ALT: 29 IU/L (ref 0–44)
AST: 15 IU/L (ref 0–40)
Albumin: 4.2 g/dL (ref 4.1–5.1)
Alkaline Phosphatase: 53 IU/L (ref 44–121)
BUN/Creatinine Ratio: 11 (ref 9–20)
BUN: 11 mg/dL (ref 6–20)
Bilirubin Total: 0.3 mg/dL (ref 0.0–1.2)
CO2: 15 mmol/L — ABNORMAL LOW (ref 20–29)
Calcium: 9.5 mg/dL (ref 8.7–10.2)
Chloride: 98 mmol/L (ref 96–106)
Creatinine, Ser: 1 mg/dL (ref 0.76–1.27)
Globulin, Total: 2.9 g/dL (ref 1.5–4.5)
Glucose: 78 mg/dL (ref 70–99)
Potassium: 5.6 mmol/L — ABNORMAL HIGH (ref 3.5–5.2)
Sodium: 136 mmol/L (ref 134–144)
Total Protein: 7.1 g/dL (ref 6.0–8.5)
eGFR: 100 mL/min/{1.73_m2} (ref >59)

## 2023-12-27 LAB — CBC WITH DIFFERENTIAL/PLATELET
Basophils Absolute: 0.1 10*3/uL (ref 0.0–0.2)
Basos: 1 %
EOS (ABSOLUTE): 0.2 10*3/uL (ref 0.0–0.4)
Eos: 3 %
Hematocrit: 51.5 % — ABNORMAL HIGH (ref 37.5–51.0)
Hemoglobin: 16.9 g/dL (ref 13.0–17.7)
Immature Grans (Abs): 0.1 10*3/uL (ref 0.0–0.1)
Immature Granulocytes: 1 %
Lymphocytes Absolute: 3 10*3/uL (ref 0.7–3.1)
Lymphs: 31 %
MCH: 27.8 pg (ref 26.6–33.0)
MCHC: 32.8 g/dL (ref 31.5–35.7)
MCV: 85 fL (ref 79–97)
Monocytes Absolute: 0.9 10*3/uL (ref 0.1–0.9)
Monocytes: 9 %
Neutrophils Absolute: 5.3 10*3/uL (ref 1.4–7.0)
Neutrophils: 55 %
Platelets: 264 10*3/uL (ref 150–450)
RBC: 6.07 x10E6/uL — ABNORMAL HIGH (ref 4.14–5.80)
RDW: 14.4 % (ref 11.6–15.4)
WBC: 9.5 10*3/uL (ref 3.4–10.8)

## 2023-12-27 LAB — HEPATIC FUNCTION PANEL: Bilirubin, Direct: 0.14 mg/dL (ref 0.00–0.40)

## 2023-12-27 LAB — THYROID PANEL WITH TSH
Free Thyroxine Index: 2.1 (ref 1.2–4.9)
T3 Uptake Ratio: 25 % (ref 24–39)
T4, Total: 8.2 ug/dL (ref 4.5–12.0)
TSH: 1.14 u[IU]/mL (ref 0.450–4.500)

## 2023-12-27 LAB — PROLACTIN: Prolactin: 16.7 ng/mL (ref 3.9–22.7)

## 2023-12-27 NOTE — Progress Notes (Signed)
 Provider called and discussed patients labs. Patient notes that he is concerned about HPV and HIV. Patient referred to Adult nurse at [place].

## 2024-01-08 ENCOUNTER — Ambulatory Visit (HOSPITAL_COMMUNITY)
Admission: EM | Admit: 2024-01-08 | Discharge: 2024-01-08 | Disposition: A | Discharge status: Home / Self Care | Attending: Internal Medicine | Admitting: Internal Medicine

## 2024-01-08 ENCOUNTER — Encounter (HOSPITAL_COMMUNITY): Payer: Self-pay

## 2024-01-08 DIAGNOSIS — Z113 Encounter for screening for infections with a predominantly sexual mode of transmission: Secondary | ICD-10-CM | POA: Insufficient documentation

## 2024-01-08 LAB — HIV ANTIBODY (ROUTINE TESTING W REFLEX): HIV Screen 4th Generation wRfx: NONREACTIVE
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
## 2024-01-08 NOTE — Discharge Instructions (Signed)
 We will inform you if any of your test come back positive

## 2024-01-08 NOTE — ED Triage Notes (Signed)
 Patient states that he has been having unprotected sex. 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

## 2024-01-09 LAB — CYTOLOGY, (ORAL, ANAL, URETHRAL) ANCILLARY ONLY
Chlamydia: NEGATIVE
Comment: NEGATIVE
Comment: NEGATIVE
Comment: NORMAL
Neisseria Gonorrhea: NEGATIVE
Trichomonas: NEGATIVE

## 2024-01-10 ENCOUNTER — Encounter: Payer: Self-pay | Admitting: Critical Care Medicine

## 2024-01-10 ENCOUNTER — Encounter: Payer: Self-pay | Admitting: *Deleted

## 2024-01-10 ENCOUNTER — Other Ambulatory Visit: Payer: Self-pay

## 2024-01-10 MED ORDER — LOSARTAN POTASSIUM 25 MG PO TABS
25.0000 mg | ORAL_TABLET | Freq: Every day | ORAL | 0 refills | Status: DC
  Filled 2024-01-10: qty 30, 30d supply, fill #0

## 2024-01-10 NOTE — Congregational Nurse Program (Addendum)
  Dept: [phone]   Congregational Nurse Program Note  Date of Encounter: 01/10/2024  Past Medical History: Past Medical History:  Diagnosis Date   Arthritis    "probably in my knees" (02/19/2013)   Bipolar disorder (HCC)    Chronic mid back pain    Obesity 02/20/2013   Schizo affective schizophrenia (HCC)    Shortness of breath    "can happen at any time" (02/19/2013)   STEMI (ST elevation myocardial infarction) (HCC) 02/20/2013    Encounter Details:  Community Questionnaire - 01/10/24 1108       Questionnaire   Ask client: Do you give verbal consent for me to treat you today? Yes    Student Assistance [employer] Nurse    Location Patient Served  [facility]    Encounter Setting CN site    Population Status Unhoused    Insurance Medicaid;Medicare    Insurance/Financial Assistance Referral N/A    Medication Have Medication Insecurities;Provided Medication Assistance;Patient Medications Reviewed    Medical Provider Yes    Screening Referrals Made N/A    Medical Referrals Made N/A    Medical Appointment Completed N/A    CNP Interventions Advocate/Support;Case Management    Screenings CN Performed Blood Pressure    ED Visit Averted N/A    Life-Saving Intervention Made N/A            Client came to nurse's office requesting a refill of his blood pressure medication.Contacted Dr Delford Field for refill. Picked up medication at [facility] pharmacy. Brought to [facility] and placed in client's medication bag per client request..  Mava Suares W RN CN

## 2024-01-15 ENCOUNTER — Ambulatory Visit (INDEPENDENT_AMBULATORY_CARE_PROVIDER_SITE_OTHER): Payer: Medicare Other

## 2024-01-15 VITALS — BP 146/83 | HR 90 | Ht 69.0 in | Wt 252.0 lb

## 2024-01-15 DIAGNOSIS — F250 Schizoaffective disorder, bipolar type: Secondary | ICD-10-CM

## 2024-01-15 MED ORDER — HALOPERIDOL DECANOATE 100 MG/ML IM SOLN
100.0000 mg | Freq: Once | INTRAMUSCULAR | Status: AC
  Administered 2024-01-15: 100 mg via INTRAMUSCULAR

## 2024-01-15 NOTE — Progress Notes (Cosign Needed Addendum)
 Pt presents today for injection of haldol 1 ml which was tolerated in right deltoid with no complaints. Pt will return in 28 days. PT IS DOING GOOD OVERALL. Has plans to see and follow up with PCP in future regarding BP. Pt has no complaints for provider at time.     JNL, CMA

## 2024-02-07 ENCOUNTER — Other Ambulatory Visit: Payer: Self-pay

## 2024-02-07 ENCOUNTER — Encounter: Payer: Self-pay | Admitting: *Deleted

## 2024-02-07 ENCOUNTER — Other Ambulatory Visit: Payer: Self-pay | Admitting: Critical Care Medicine

## 2024-02-07 MED ORDER — LOSARTAN POTASSIUM 25 MG PO TABS
25.0000 mg | ORAL_TABLET | Freq: Every day | ORAL | 0 refills | Status: DC
  Filled 2024-02-07: qty 30, 30d supply, fill #0

## 2024-02-07 NOTE — Congregational Nurse Program (Signed)
  Dept: [phone]   Congregational Nurse Program Note  Date of Encounter: 02/07/2024  Past Medical History: Past Medical History:  Diagnosis Date   Arthritis    "probably in my knees" (02/19/2013)   Bipolar disorder (HCC)    Chronic mid back pain    Obesity 02/20/2013   Schizo affective schizophrenia (HCC)    Shortness of breath    "can happen at any time" (02/19/2013)   STEMI (ST elevation myocardial infarction) (HCC) 02/20/2013    Encounter Details:  Community Questionnaire - 02/07/24 1358       Questionnaire   Ask client: Do you give verbal consent for me to treat you today? Yes    Student Assistance N/A    Location Patient Served  [facility]    Encounter Setting Phone/Text/Email;CN site    Population Status Unhoused    Insurance Medicaid;Medicare    Insurance/Financial Assistance Referral N/A    Medication Have Medication Insecurities    Medical Provider Yes    Screening Referrals Made N/A    Medical Referrals Made N/A    Medical Appointment Completed N/A    CNP Interventions Advocate/Support;Navigate Healthcare System;Case Management    Screenings CN Performed Blood Pressure    ED Visit Averted N/A    Life-Saving Intervention Made N/A           Client came to nurse's office requesting assistance with refill of cozaar. Contacted [facility] pharmacy and sent request to [facility] MD. Offered bus pass to client to p/u medication when refilled and he declined requesting writer to pick up medication. Offered to p/u on Monday as waiting of refill and client has enough medication to last through Monday. Client is to see writer Monday morning if medication is ready for pick up. Abhijot Straughter W RN CN

## 2024-02-07 NOTE — Telephone Encounter (Signed)
 Received call to get losartan refill order sent

## 2024-02-11 ENCOUNTER — Encounter: Payer: Self-pay | Admitting: *Deleted

## 2024-02-11 ENCOUNTER — Other Ambulatory Visit: Payer: Self-pay

## 2024-02-11 NOTE — Congregational Nurse Program (Signed)
  Dept: [phone]   Congregational Nurse Program Note  Date of Encounter: 02/11/2024  Past Medical History: Past Medical History:  Diagnosis Date   Arthritis    "probably in my knees" (02/19/2013)   Bipolar disorder (HCC)    Chronic mid back pain    Obesity 02/20/2013   Schizo affective schizophrenia (HCC)    Shortness of breath    "can happen at any time" (02/19/2013)   STEMI (ST elevation myocardial infarction) (HCC) 02/20/2013    Encounter Details:  Community Questionnaire - 02/11/24 1323       Questionnaire   Ask client: Do you give verbal consent for me to treat you today? N/A    Student Assistance N/A    Location Patient Served  [facility]    Encounter Setting CN site    Population Status Unhoused    Insurance Medicaid;Medicare    Insurance/Financial Assistance Referral N/A    Medication Have Medication Insecurities;Provided Medication Assistance    Medical Provider Yes    Screening Referrals Made N/A    Medical Referrals Made N/A    Medical Appointment Completed N/A    CNP Interventions Advocate/Support;Case Management    Screenings CN Performed N/A    ED Visit Averted N/A    Life-Saving Intervention Made N/A            Picked up blood pressure medication from [facility] pharmacy and brought to [facility]. Writer could not locate client. Left medication with staff at front desk [facility] shelter for client with name and bed number. Lashayla Armes W RN CN

## 2024-02-12 ENCOUNTER — Ambulatory Visit (INDEPENDENT_AMBULATORY_CARE_PROVIDER_SITE_OTHER)

## 2024-02-12 VITALS — BP 141/85 | HR 89 | Ht 67.0 in | Wt 260.0 lb

## 2024-02-12 DIAGNOSIS — F250 Schizoaffective disorder, bipolar type: Secondary | ICD-10-CM

## 2024-02-12 MED ORDER — HALOPERIDOL DECANOATE 100 MG/ML IM SOLN
100.0000 mg | Freq: Once | INTRAMUSCULAR | Status: AC
  Administered 2024-02-12: 100 mg via INTRAMUSCULAR

## 2024-02-12 NOTE — Progress Notes (Cosign Needed)
 Pt presents today for injection of haldol  1 ml which was tolerated in left deltoid with no complaints. Pt will return in 28 days. PT IS DOING GOOD OVERALL. Has plans to see and follow up with PCP in future regarding BP. Pt has no complaints for provider at time.     JNL, CMA

## 2024-03-03 ENCOUNTER — Encounter: Payer: Self-pay | Admitting: *Deleted

## 2024-03-03 NOTE — Congregational Nurse Program (Signed)
  Dept: [phone]   Congregational Nurse Program Note  Date of Encounter: 03/03/2024  Past Medical History: Past Medical History:  Diagnosis Date   Arthritis    "probably in my knees" (02/19/2013)   Bipolar disorder (HCC)    Chronic mid back pain    Obesity 02/20/2013   Schizo affective schizophrenia (HCC)    Shortness of breath    "can happen at any time" (02/19/2013)   STEMI (ST elevation myocardial infarction) (HCC) 02/20/2013    Encounter Details:  Community Questionnaire - 03/03/24 0940       Questionnaire   Ask client: Do you give verbal consent for me to treat you today? Yes    Student Assistance N/A    Location Patient Served  [facility]    Encounter Setting CN site    Population Status Unhoused    Insurance Medicaid;Medicare    Insurance/Financial Assistance Referral N/A    Medication N/A    Medical Provider Yes    Screening Referrals Made N/A    Medical Referrals Made N/A    Medical Appointment Completed N/A    CNP Interventions Advocate/Support    Screenings CN Performed Blood Pressure    ED Visit Averted N/A    Life-Saving Intervention Made N/A            Client came to nurse's office reporting he has 4 or 5 blood pressure pills left. Client has an upcoming appt to establish a new PCP in two days. Information reviewed with client and explained PCP would review medication and refill if needed. Took vitals. Blood pressure 114/79, pulse 88, SpO2 95%. Offered support and encouragement.  Cayli Escajeda W RN CN

## 2024-03-04 ENCOUNTER — Encounter (HOSPITAL_COMMUNITY): Payer: Medicare Other | Admitting: Psychiatry

## 2024-03-05 ENCOUNTER — Ambulatory Visit: Payer: Self-pay | Admitting: Family Medicine

## 2024-03-05 ENCOUNTER — Ambulatory Visit: Admitting: Family Medicine

## 2024-03-05 ENCOUNTER — Encounter: Payer: Self-pay | Admitting: Family Medicine

## 2024-03-05 VITALS — BP 130/82 | HR 100 | Temp 98.7°F | Ht 67.0 in | Wt 271.8 lb

## 2024-03-05 DIAGNOSIS — E782 Mixed hyperlipidemia: Secondary | ICD-10-CM

## 2024-03-05 DIAGNOSIS — I428 Other cardiomyopathies: Secondary | ICD-10-CM

## 2024-03-05 DIAGNOSIS — Z79899 Other long term (current) drug therapy: Secondary | ICD-10-CM

## 2024-03-05 DIAGNOSIS — E66813 Obesity, class 3: Secondary | ICD-10-CM

## 2024-03-05 DIAGNOSIS — Z720 Tobacco use: Secondary | ICD-10-CM

## 2024-03-05 DIAGNOSIS — F250 Schizoaffective disorder, bipolar type: Secondary | ICD-10-CM

## 2024-03-05 DIAGNOSIS — Z6841 Body Mass Index (BMI) 40.0 and over, adult: Secondary | ICD-10-CM | POA: Diagnosis not present

## 2024-03-05 DIAGNOSIS — I10 Essential (primary) hypertension: Secondary | ICD-10-CM

## 2024-03-05 DIAGNOSIS — F1290 Cannabis use, unspecified, uncomplicated: Secondary | ICD-10-CM

## 2024-03-05 DIAGNOSIS — Z5900 Homelessness unspecified: Secondary | ICD-10-CM

## 2024-03-05 LAB — CBC WITH DIFFERENTIAL/PLATELET
Basophils Absolute: 0.1 10*3/uL (ref 0.0–0.1)
Basophils Relative: 0.7 % (ref 0.0–3.0)
Eosinophils Absolute: 0.3 10*3/uL (ref 0.0–0.7)
Eosinophils Relative: 3.1 % (ref 0.0–5.0)
HCT: 49 % (ref 39.0–52.0)
Hemoglobin: 16.2 g/dL (ref 13.0–17.0)
Lymphocytes Relative: 32.3 % (ref 12.0–46.0)
Lymphs Abs: 2.7 10*3/uL (ref 0.7–4.0)
MCHC: 33 g/dL (ref 30.0–36.0)
MCV: 84 fl (ref 78.0–100.0)
Monocytes Absolute: 0.9 10*3/uL (ref 0.1–1.0)
Monocytes Relative: 10.9 % (ref 3.0–12.0)
Neutro Abs: 4.4 10*3/uL (ref 1.4–7.7)
Neutrophils Relative %: 53 % (ref 43.0–77.0)
Platelets: 237 10*3/uL (ref 150.0–400.0)
RBC: 5.83 Mil/uL — ABNORMAL HIGH (ref 4.22–5.81)
RDW: 14.9 % (ref 11.5–15.5)
WBC: 8.3 10*3/uL (ref 4.0–10.5)

## 2024-03-05 LAB — COMPREHENSIVE METABOLIC PANEL WITH GFR
ALT: 41 U/L (ref 0–53)
AST: 19 U/L (ref 0–37)
Albumin: 4.3 g/dL (ref 3.5–5.2)
Alkaline Phosphatase: 38 U/L — ABNORMAL LOW (ref 39–117)
BUN: 12 mg/dL (ref 6–23)
CO2: 26 meq/L (ref 19–32)
Calcium: 9.2 mg/dL (ref 8.4–10.5)
Chloride: 101 meq/L (ref 96–112)
Creatinine, Ser: 0.91 mg/dL (ref 0.40–1.50)
GFR: 108.62 mL/min (ref >60.00)
Glucose, Bld: 164 mg/dL — ABNORMAL HIGH (ref 70–99)
Potassium: 4.1 meq/L (ref 3.5–5.1)
Sodium: 135 meq/L (ref 135–145)
Total Bilirubin: 0.3 mg/dL (ref 0.2–1.2)
Total Protein: 7.2 g/dL (ref 6.0–8.3)

## 2024-03-05 LAB — LIPID PANEL
Cholesterol: 246 mg/dL — ABNORMAL HIGH (ref 0–200)
HDL: 53 mg/dL (ref >39.00)
LDL Cholesterol: 136 mg/dL — ABNORMAL HIGH (ref 0–99)
NonHDL: 193.42
Total CHOL/HDL Ratio: 5
Triglycerides: 289 mg/dL — ABNORMAL HIGH (ref 0.0–149.0)
VLDL: 57.8 mg/dL — ABNORMAL HIGH (ref 0.0–40.0)

## 2024-03-05 MED ORDER — ROSUVASTATIN CALCIUM 10 MG PO TABS: 10.0000 mg | ORAL_TABLET | Freq: Every day | ORAL | 1 refills | Status: DC

## 2024-03-05 MED ORDER — LOSARTAN POTASSIUM 25 MG PO TABS
25.0000 mg | ORAL_TABLET | Freq: Every day | ORAL | 1 refills | Status: DC
Start: 2024-03-05 — End: 2024-06-10

## 2024-03-05 NOTE — Patient Instructions (Signed)
 Welcome to [facility]!  Thank you for choosing us  for your Primary Care needs.   We offer in person and video appointments for your convenience. You may call our office to schedule appointments, or you may schedule appointments with me through MyChart.   The best way to get in contact with me is via MyChart message. This will get to me faster than a phone call, unless there is an emergency, then please call 911.  The lab is located downstairs in the Sports Medicine building, we also have xray available there.   We are checking labs today, will be in contact with any results that require further attention  Follow up with me in a month to recheck blood pressures and medications, sooner if needed.

## 2024-03-11 ENCOUNTER — Encounter (HOSPITAL_COMMUNITY): Payer: Self-pay | Admitting: Psychiatry

## 2024-03-11 ENCOUNTER — Ambulatory Visit (HOSPITAL_COMMUNITY)

## 2024-03-11 ENCOUNTER — Encounter (HOSPITAL_COMMUNITY): Payer: Self-pay

## 2024-03-11 ENCOUNTER — Ambulatory Visit (INDEPENDENT_AMBULATORY_CARE_PROVIDER_SITE_OTHER): Admitting: Psychiatry

## 2024-03-11 VITALS — BP 154/89 | HR 100 | Wt 267.4 lb

## 2024-03-11 DIAGNOSIS — F200 Paranoid schizophrenia: Secondary | ICD-10-CM

## 2024-03-11 DIAGNOSIS — F250 Schizoaffective disorder, bipolar type: Secondary | ICD-10-CM | POA: Diagnosis not present

## 2024-03-11 DIAGNOSIS — G4700 Insomnia, unspecified: Secondary | ICD-10-CM

## 2024-03-11 DIAGNOSIS — F411 Generalized anxiety disorder: Secondary | ICD-10-CM | POA: Diagnosis not present

## 2024-03-11 MED ORDER — HALOPERIDOL DECANOATE 100 MG/ML IM SOLN
100.0000 mg | Freq: Once | INTRAMUSCULAR | Status: AC
Start: 2024-03-11 — End: 2024-03-11
  Administered 2024-03-11: 100 mg via INTRAMUSCULAR

## 2024-03-11 MED ORDER — HALOPERIDOL DECANOATE 100 MG/ML IM SOLN: 100.0000 mg | INTRAMUSCULAR | 11 refills | Status: DC
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
## 2024-03-11 NOTE — Progress Notes (Signed)
 Pt presents today for injection of haldol  1 ml which was tolerated in Right deltoid with no complaints. Pt will return in 28 days. PT IS DOING GOOD OVERALL. Has plans to see and follow up with PCP in future regarding BP. Pt has no complaints for provider at time.

## 2024-03-12 ENCOUNTER — Encounter: Payer: Self-pay | Admitting: Family Medicine

## 2024-03-12 ENCOUNTER — Telehealth: Payer: Self-pay | Admitting: *Deleted

## 2024-03-12 DIAGNOSIS — Z5900 Homelessness unspecified: Secondary | ICD-10-CM | POA: Insufficient documentation

## 2024-03-12 DIAGNOSIS — E782 Mixed hyperlipidemia: Secondary | ICD-10-CM | POA: Insufficient documentation

## 2024-03-12 DIAGNOSIS — I10 Essential (primary) hypertension: Secondary | ICD-10-CM | POA: Insufficient documentation

## 2024-03-12 DIAGNOSIS — Z79899 Other long term (current) drug therapy: Secondary | ICD-10-CM | POA: Insufficient documentation

## 2024-03-12 DIAGNOSIS — I429 Cardiomyopathy, unspecified: Secondary | ICD-10-CM | POA: Insufficient documentation

## 2024-03-12 NOTE — Assessment & Plan Note (Signed)
Lipids today

## 2024-03-12 NOTE — Assessment & Plan Note (Signed)
 Discussed healthy diet and activity level

## 2024-03-12 NOTE — Assessment & Plan Note (Signed)
 [facility] referral today [place] 2 completed for group home/adult care home/assisted living

## 2024-03-12 NOTE — Assessment & Plan Note (Signed)
 Refilled losartan , controlled, kidney function today

## 2024-03-12 NOTE — Progress Notes (Signed)
 Complex Care Management Note  Care Guide Note 03/12/2024 Name: ADOLPHE FORTUNATO MRN: [id number] DOB: 04/11/1986  Werner MURAT RIDEOUT is a 38 y.o. year old male who sees Wellington Half, FNP for primary care. I reached out to [employer] by phone today to offer complex care management services.  Mr. Miklos was given information about Complex Care Management services today including:   The Complex Care Management services include support from the care team which includes your Nurse Care Manager, Clinical Social Worker, or Pharmacist.  The Complex Care Management team is here to help remove barriers to the health concerns and goals most important to you. Complex Care Management services are voluntary, and the patient may decline or stop services at any time by request to their care team member.   Complex Care Management Consent Status: Patient agreed to services and verbal consent obtained.   Follow up plan:  Telephone appointment with complex care management team member scheduled for:  03/28/2024  Encounter Outcome:  Patient Scheduled  Kandis Ormond, CMA [place]  [facility] Guide Direct Dial: [phone]  Fax: [phone] Website: [email or web address]

## 2024-03-12 NOTE — Assessment & Plan Note (Signed)
 Not ready to quit.

## 2024-03-12 NOTE — Assessment & Plan Note (Signed)
 Stable, continue to follow-up with behavioral health, continue Haldol  injections once monthly

## 2024-03-12 NOTE — Assessment & Plan Note (Signed)
 Hx MI, labs today, discussed importance of blood pressure control

## 2024-03-12 NOTE — Assessment & Plan Note (Signed)
 Labs today, will dose adjust as needed
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
## 2024-03-28 ENCOUNTER — Other Ambulatory Visit: Payer: Self-pay | Admitting: *Deleted

## 2024-03-28 NOTE — Patient Outreach (Signed)
 Patient contacted for initial assessment-discussed reason for referral, patient abruptly ended the call stating that he did not need any assistance.   Jashay Roddy, LCSW [place]  [facility], [facility] Licensed Clinical Social Worker Care Coordinator  Direct Dial: [phone]

## 2024-04-08 ENCOUNTER — Ambulatory Visit (INDEPENDENT_AMBULATORY_CARE_PROVIDER_SITE_OTHER)

## 2024-04-08 VITALS — BP 120/81 | HR 79 | Temp 97.8°F | Ht 67.0 in | Wt 275.0 lb

## 2024-04-08 DIAGNOSIS — F250 Schizoaffective disorder, bipolar type: Secondary | ICD-10-CM | POA: Diagnosis not present

## 2024-04-08 MED ORDER — HALOPERIDOL DECANOATE 100 MG/ML IM SOLN
100.0000 mg | Freq: Once | INTRAMUSCULAR | Status: AC
  Administered 2024-04-08: 100 mg via INTRAMUSCULAR

## 2024-04-08 NOTE — Progress Notes (Cosign Needed Addendum)
 Pt presents today for injection of haldol  5ml/100mg , this was given in patients Left deltoid with no complaints. Pt denies all AVH, SI, and HI. Patient states that he is doing good overall, but is highly interested in  managing his weight loss. Patient is highly interested in [facility] to manage this. This was addressed to provider of the day.    JNL

## 2024-04-30 ENCOUNTER — Ambulatory Visit

## 2024-05-06 ENCOUNTER — Ambulatory Visit (INDEPENDENT_AMBULATORY_CARE_PROVIDER_SITE_OTHER)

## 2024-05-06 VITALS — BP 113/70 | HR 90 | Ht 67.0 in | Wt 273.6 lb

## 2024-05-06 DIAGNOSIS — F250 Schizoaffective disorder, bipolar type: Secondary | ICD-10-CM | POA: Diagnosis not present

## 2024-05-06 MED ORDER — HALOPERIDOL DECANOATE 100 MG/ML IM SOLN
100.0000 mg | Freq: Once | INTRAMUSCULAR | Status: AC
  Administered 2024-05-06: 100 mg via INTRAMUSCULAR

## 2024-05-06 NOTE — Progress Notes (Cosign Needed)
 Pt presents today for injection of haldol  62ml/100mg , this was given in patients Right deltoid with no complaints. Pt denies all AVH, SI, and HI. Patient states that he is doing good overall, but is highly interested in  managing his weight loss.    JNL, CMA

## 2024-05-30 ENCOUNTER — Ambulatory Visit

## 2024-05-30 VITALS — BP 122/80 | HR 95 | Ht 68.5 in | Wt 281.6 lb

## 2024-05-30 DIAGNOSIS — Z0000 Encounter for general adult medical examination without abnormal findings: Secondary | ICD-10-CM
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
## 2024-05-30 NOTE — Patient Instructions (Addendum)
 Mr. Jake Sanchez , Thank you for taking time out of your busy schedule to complete your Annual Wellness Visit with me. I enjoyed our conversation and look forward to speaking with you again next year. I, as well as your care team,  appreciate your ongoing commitment to your health goals. Please review the following plan we discussed and let me know if I can assist you in the future. Your Game plan/ To Do List    Referrals: If you haven't heard from the office you've been referred to, please reach out to them at the phone provided.   Follow up Visits: We will see or speak with you next year for your Next Medicare AWV with our clinical staff Have you seen your provider in the last 6 months (3 months if uncontrolled diabetes)? Yes  Clinician Recommendations:  Aim for 30 minutes of exercise or brisk walking, 6-8 glasses of water , and 5 servings of fruits and vegetables each day. You are due for a tetanus vaccine and can get that done at the pharmacy.  You are also due for a Hep B vaccine, an HPV vaccine and a pneumonia vaccine and can get those done during your next office visit.  Remember to bring any paper work back in regards to disability appeal.        This is a list of the screenings recommended for you:  Health Maintenance  Topic Date Due   COVID-19 Vaccine (1) Never done   Hepatitis C Screening  Never done   DTaP/Tdap/Td vaccine (1 - Tdap) Never done   Pneumococcal Vaccine for high risk medical condition (1 of 2 - PCV) Never done   Hepatitis B Vaccine (1 of 3 - 19+ 3-dose series) Never done   HPV Vaccine (1 - 3-dose SCDM series) Never done   Flu Shot  05/23/2024   Medicare Annual Wellness Visit  05/30/2025   HIV Screening  Completed   Meningitis B Vaccine  Aged Out    Advanced directives: (Declined) Advance directive discussed with you today. Even though you declined this today, please call our office should you change your mind, and we can give you the proper paperwork for you to fill  out. Advance Care Planning is important because it:  [x]  Makes sure you receive the medical care that is consistent with your values, goals, and preferences  [x]  It provides guidance to your family and loved ones and reduces their decisional burden about whether or not they are making the right decisions based on your wishes.  Follow the link provided in your after visit summary or read over the paperwork we have mailed to you to help you started getting your Advance Directives in place. If you need assistance in completing these, please reach out to us  so that we can help you!  See attachments for Preventive Care and Fall Prevention Tips.

## 2024-06-05 ENCOUNTER — Ambulatory Visit: Admitting: Family Medicine

## 2024-06-05 ENCOUNTER — Ambulatory Visit (INDEPENDENT_AMBULATORY_CARE_PROVIDER_SITE_OTHER)

## 2024-06-05 ENCOUNTER — Encounter (HOSPITAL_COMMUNITY): Payer: Self-pay

## 2024-06-05 VITALS — BP 143/91 | HR 95 | Wt 275.8 lb

## 2024-06-05 DIAGNOSIS — F411 Generalized anxiety disorder: Secondary | ICD-10-CM

## 2024-06-05 DIAGNOSIS — F200 Paranoid schizophrenia: Secondary | ICD-10-CM | POA: Diagnosis not present

## 2024-06-05 DIAGNOSIS — G4700 Insomnia, unspecified: Secondary | ICD-10-CM

## 2024-06-05 MED ORDER — HALOPERIDOL DECANOATE 100 MG/ML IM SOLN
100.0000 mg | Freq: Once | INTRAMUSCULAR | Status: AC
  Administered 2024-06-05: 100 mg via INTRAMUSCULAR

## 2024-06-05 NOTE — Progress Notes (Signed)
 Pt presents today for injection of haldol  1 ml which was tolerated in Right deltoid with no complaints. Pt will return in 28 days. PT IS DOING GOOD OVERALL

## 2024-06-10 ENCOUNTER — Encounter: Payer: Self-pay | Admitting: Family Medicine

## 2024-06-10 ENCOUNTER — Ambulatory Visit: Payer: Self-pay | Admitting: Family Medicine

## 2024-06-10 ENCOUNTER — Ambulatory Visit: Admitting: Family Medicine

## 2024-06-10 VITALS — BP 128/80 | HR 85 | Temp 97.9°F | Ht 68.5 in | Wt 278.0 lb

## 2024-06-10 DIAGNOSIS — I10 Essential (primary) hypertension: Secondary | ICD-10-CM | POA: Diagnosis not present

## 2024-06-10 DIAGNOSIS — E1165 Type 2 diabetes mellitus with hyperglycemia: Secondary | ICD-10-CM

## 2024-06-10 DIAGNOSIS — R7309 Other abnormal glucose: Secondary | ICD-10-CM

## 2024-06-10 DIAGNOSIS — I428 Other cardiomyopathies: Secondary | ICD-10-CM

## 2024-06-10 DIAGNOSIS — Z720 Tobacco use: Secondary | ICD-10-CM | POA: Diagnosis not present

## 2024-06-10 DIAGNOSIS — Z79899 Other long term (current) drug therapy: Secondary | ICD-10-CM | POA: Diagnosis not present

## 2024-06-10 DIAGNOSIS — F250 Schizoaffective disorder, bipolar type: Secondary | ICD-10-CM

## 2024-06-10 DIAGNOSIS — E782 Mixed hyperlipidemia: Secondary | ICD-10-CM

## 2024-06-10 DIAGNOSIS — Z23 Encounter for immunization: Secondary | ICD-10-CM | POA: Diagnosis not present

## 2024-06-10 DIAGNOSIS — Z6841 Body Mass Index (BMI) 40.0 and over, adult: Secondary | ICD-10-CM

## 2024-06-10 LAB — COMPREHENSIVE METABOLIC PANEL WITH GFR
ALT: 28 U/L (ref 0–53)
AST: 14 U/L (ref 0–37)
Albumin: 4 g/dL (ref 3.5–5.2)
Alkaline Phosphatase: 41 U/L (ref 39–117)
BUN: 9 mg/dL (ref 6–23)
CO2: 28 meq/L (ref 19–32)
Calcium: 8.7 mg/dL (ref 8.4–10.5)
Chloride: 98 meq/L (ref 96–112)
Creatinine, Ser: 0.93 mg/dL (ref 0.40–1.50)
GFR: 105.63 mL/min (ref >60.00)
Glucose, Bld: 341 mg/dL — ABNORMAL HIGH (ref 70–99)
Potassium: 4.3 meq/L (ref 3.5–5.1)
Sodium: 134 meq/L — ABNORMAL LOW (ref 135–145)
Total Bilirubin: 0.4 mg/dL (ref 0.2–1.2)
Total Protein: 7 g/dL (ref 6.0–8.3)

## 2024-06-10 LAB — CBC WITH DIFFERENTIAL/PLATELET
Basophils Absolute: 0 K/uL (ref 0.0–0.1)
Basophils Relative: 0.5 % (ref 0.0–3.0)
Eosinophils Absolute: 0.2 K/uL (ref 0.0–0.7)
Eosinophils Relative: 2.6 % (ref 0.0–5.0)
HCT: 47 % (ref 39.0–52.0)
Hemoglobin: 15.3 g/dL (ref 13.0–17.0)
Lymphocytes Relative: 25.4 % (ref 12.0–46.0)
Lymphs Abs: 1.9 K/uL (ref 0.7–4.0)
MCHC: 32.6 g/dL (ref 30.0–36.0)
MCV: 84.7 fl (ref 78.0–100.0)
Monocytes Absolute: 0.8 K/uL (ref 0.1–1.0)
Monocytes Relative: 10.4 % (ref 3.0–12.0)
Neutro Abs: 4.5 K/uL (ref 1.4–7.7)
Neutrophils Relative %: 61.1 % (ref 43.0–77.0)
Platelets: 191 K/uL (ref 150.0–400.0)
RBC: 5.55 Mil/uL (ref 4.22–5.81)
RDW: 13.7 % (ref 11.5–15.5)
WBC: 7.4 K/uL (ref 4.0–10.5)

## 2024-06-10 LAB — LIPID PANEL
Cholesterol: 192 mg/dL (ref 0–200)
HDL: 43.3 mg/dL (ref >39.00)
LDL Cholesterol: 118 mg/dL — ABNORMAL HIGH (ref 0–99)
NonHDL: 149.1
Total CHOL/HDL Ratio: 4
Triglycerides: 158 mg/dL — ABNORMAL HIGH (ref 0.0–149.0)
VLDL: 31.6 mg/dL (ref 0.0–40.0)

## 2024-06-10 LAB — HEMOGLOBIN A1C: Hgb A1c MFr Bld: 8.7 % — ABNORMAL HIGH (ref 4.6–6.5)

## 2024-06-10 MED ORDER — LOSARTAN POTASSIUM 25 MG PO TABS: 25.0000 mg | ORAL_TABLET | Freq: Every day | ORAL | 1 refills | Status: AC

## 2024-06-10 MED ORDER — ROSUVASTATIN CALCIUM 20 MG PO TABS: 20.0000 mg | ORAL_TABLET | Freq: Every day | ORAL | 1 refills | Status: AC

## 2024-06-10 MED ORDER — ROSUVASTATIN CALCIUM 10 MG PO TABS
10.0000 mg | ORAL_TABLET | Freq: Every day | ORAL | 1 refills | Status: DC
Start: 2024-06-10 — End: 2024-06-10

## 2024-06-10 MED ORDER — METFORMIN HCL 500 MG PO TABS: 500.0000 mg | ORAL_TABLET | Freq: Two times a day (BID) | ORAL | 3 refills | Status: AC

## 2024-06-10 NOTE — Patient Instructions (Signed)
 We are checking labs today, will be in contact with any results that require further attention  We have given your pneumonia vaccine today.  Continue losartan  and rosuvastatin .   Follow up with me in about 3 months for labs and medication management, sooner if needed.

## 2024-06-10 NOTE — Progress Notes (Signed)
 Established Patient Office Visit  Subjective:     Patient ID: Jake Sanchez, male    DOB: 04-28-1986, 38 y.o.   MRN: [id number]  Chief Complaint  Patient presents with   Hypertension    HPI  Discussed the use of AI scribe software for clinical note transcription with the patient, who gave verbal consent to proceed.  History of Present Illness Jake Sanchez is a 38 year old male with hypertension and hyperlipidemia who presents for follow-up and vaccination.  Hypertension and hyperlipidemia management - Blood pressure and cholesterol controlled with current medications - No dizziness - No peripheral edema - Medication supply remains adequate  Immunization status - Inquired about additional vaccines - Due for Tetanus, has medicare, will need to get from pharmacy - Flu vaccine will be available in September     ROS Per HPI      Objective:    BP 128/80 (BP Location: Right Arm, Patient Position: Sitting, Cuff Size: Large)   Pulse 85   Temp 97.9 F (36.6 C)   Ht 5' 8.5 (1.74 m)   Wt 278 lb (126.1 kg)   SpO2 96%   BMI 41.65 kg/m    Physical Exam Vitals and nursing note reviewed.  Constitutional:      General: He is not in acute distress.    Appearance: Normal appearance. He is obese.  HENT:     Head: Normocephalic and atraumatic.     Right Ear: External ear normal.     Left Ear: External ear normal.     Nose: Nose normal.     Mouth/Throat:     Mouth: Mucous membranes are moist.     Pharynx: Oropharynx is clear.  Eyes:     Extraocular Movements: Extraocular movements intact.  Cardiovascular:     Rate and Rhythm: Normal rate and regular rhythm.     Pulses: Normal pulses.     Heart sounds: Normal heart sounds.  Pulmonary:     Effort: Pulmonary effort is normal. No respiratory distress.     Breath sounds: Normal breath sounds. No wheezing, rhonchi or rales.  Musculoskeletal:        General: Normal range of motion.     Cervical back: Normal  range of motion.     Right lower leg: No edema.     Left lower leg: No edema.  Lymphadenopathy:     Cervical: No cervical adenopathy.  Skin:    General: Skin is warm and dry.  Neurological:     General: No focal deficit present.     Mental Status: He is alert and oriented to person, place, and time.  Psychiatric:        Mood and Affect: Mood normal. Affect is flat.        Behavior: Behavior normal.     No results found for any visits on 06/10/24.    BP Readings from Last 3 Encounters:  06/10/24 128/80  05/30/24 122/80  03/05/24 130/82   Wt Readings from Last 3 Encounters:  06/10/24 278 lb (126.1 kg)  05/30/24 281 lb 9.6 oz (127.7 kg)  03/05/24 271 lb 12.8 oz (123.3 kg)      Last CBC Lab Results  Component Value Date   WBC 8.3 03/05/2024   HGB 16.2 03/05/2024   HCT 49.0 03/05/2024   MCV 84.0 03/05/2024   MCH 27.8 12/26/2023   RDW 14.9 03/05/2024   PLT 237.0 03/05/2024   Last metabolic panel Lab Results  Component Value Date  GLUCOSE 164 (H) 03/05/2024   NA 135 03/05/2024   K 4.1 03/05/2024   CL 101 03/05/2024   CO2 26 03/05/2024   BUN 12 03/05/2024   CREATININE 0.91 03/05/2024   GFR 108.62 03/05/2024   CALCIUM  9.2 03/05/2024   PROT 7.2 03/05/2024   ALBUMIN 4.3 03/05/2024   LABGLOB 2.9 12/26/2023   BILITOT 0.3 03/05/2024   ALKPHOS 38 (L) 03/05/2024   AST 19 03/05/2024   ALT 41 03/05/2024   ANIONGAP 12 12/18/2020   Last lipids Lab Results  Component Value Date   CHOL 246 (H) 03/05/2024   HDL 53.00 03/05/2024   LDLCALC 136 (H) 03/05/2024   TRIG 289.0 (H) 03/05/2024   CHOLHDL 5 03/05/2024   Last hemoglobin A1c Lab Results  Component Value Date   HGBA1C 5.3 12/18/2020   Last thyroid  functions Lab Results  Component Value Date   TSH 1.140 12/26/2023   T4TOTAL 8.2 12/26/2023   Last vitamin D No results found for: 25OHVITD2, 25OHVITD3, VD25OH Last vitamin B12 and Folate No results found for: VITAMINB12, FOLATE        Assessment & Plan:   Assessment and Plan Assessment & Plan Primary hypertension/ Cardiomyopathy Blood pressure slightly elevated but improved. No dizziness with current medication. - Continue Losartan  25 MG orally daily. - Refilled today  Mixed hyperlipidemia Managed with medication. - Continue current cholesterol medication. - Check liver function and cholesterol levels in 3 months. - Refilled rosuvastatin  today  Immunizations - PCV 20 given today - Will get Tetanus at pharmacy  Schizoaffective Disorder, Bipolar Type - F/u with psych as scheduled - Continue Haldol   Tobacco use - not ready to quit  BMI 41 - Discussed healthy diet and activity level - Housing unstable at times, working on improving this with case mgr     Orders Placed This Encounter  Procedures   Pneumococcal conjugate vaccine 20-valent (Prevnar 20)   CBC with Differential/Platelet    Release to patient:   Immediate [1]   Comprehensive metabolic panel with GFR    Release to patient:   Immediate [1]   Lipid panel   HgB A1c     Meds ordered this encounter  Medications   losartan  (COZAAR ) 25 MG tablet    Sig: Take 1 tablet (25 mg total) by mouth daily.    Dispense:  90 tablet    Refill:  1   rosuvastatin  (CRESTOR ) 10 MG tablet    Sig: Take 1 tablet (10 mg total) by mouth daily.    Dispense:  90 tablet    Refill:  1    Return in about 3 months (around 09/10/2024) for meds.  Corean LITTIE Ku, FNP

## 2024-07-03 ENCOUNTER — Ambulatory Visit (INDEPENDENT_AMBULATORY_CARE_PROVIDER_SITE_OTHER): Admitting: Family

## 2024-07-03 ENCOUNTER — Encounter (HOSPITAL_COMMUNITY): Payer: Self-pay

## 2024-07-03 ENCOUNTER — Ambulatory Visit (INDEPENDENT_AMBULATORY_CARE_PROVIDER_SITE_OTHER)

## 2024-07-03 VITALS — BP 152/98 | HR 86 | Ht 67.0 in | Wt 261.0 lb

## 2024-07-03 DIAGNOSIS — F250 Schizoaffective disorder, bipolar type: Secondary | ICD-10-CM | POA: Diagnosis not present

## 2024-07-03 DIAGNOSIS — F200 Paranoid schizophrenia: Secondary | ICD-10-CM

## 2024-07-03 MED ORDER — HALOPERIDOL DECANOATE 100 MG/ML IM SOLN
100.0000 mg | INTRAMUSCULAR | Status: AC
  Administered 2024-07-03 – 2024-10-29 (×5): 100 mg via INTRAMUSCULAR
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
## 2024-07-03 NOTE — Progress Notes (Cosign Needed)
 Patient arrives today for his due injection of Haloperidol  Dec 100 mg/mL. Patient presents well groomed and with an appropriate affect. Patient saw Staci before his injection. Patient denies any SI/HI or AVH. Injection was prepared as ordered and was administered in patients Right Deltoid. Patient tolerated well and without complaint    NDC: [phone] LOT: [id number] EXP: 2026/AUG

## 2024-07-13 DIAGNOSIS — E1165 Type 2 diabetes mellitus with hyperglycemia: Secondary | ICD-10-CM | POA: Insufficient documentation

## 2024-07-13 NOTE — Patient Instructions (Incomplete)
 Referral has been seen to diabetes educator to help establish good habits to manage diabetes.

## 2024-07-13 NOTE — Progress Notes (Deleted)
   Established Patient Office Visit  Subjective:     Patient ID: Jake Sanchez, male    DOB: [birth year], 38 y.o.   MRN: [id number]  No chief complaint on file.   HPI  Discussed the use of AI scribe software for clinical note transcription with the patient, who gave verbal consent to proceed.  History of Present Illness      ROS Per HPI      Objective:    There were no vitals taken for this visit.   Physical Exam Vitals and nursing note reviewed.  Constitutional:      General: He is not in acute distress.    Appearance: Normal appearance.  HENT:     Head: Normocephalic and atraumatic.     Right Ear: External ear normal.     Left Ear: External ear normal.     Nose: Nose normal.     Mouth/Throat:     Mouth: Mucous membranes are moist.     Pharynx: Oropharynx is clear.  Eyes:     Extraocular Movements: Extraocular movements intact.  Cardiovascular:     Rate and Rhythm: Normal rate and regular rhythm.     Pulses: Normal pulses.     Heart sounds: Normal heart sounds.  Pulmonary:     Effort: Pulmonary effort is normal. No respiratory distress.     Breath sounds: Normal breath sounds. No wheezing, rhonchi or rales.  Musculoskeletal:        General: Normal range of motion.     Cervical back: Normal range of motion.     Right lower leg: No edema.     Left lower leg: No edema.  Lymphadenopathy:     Cervical: No cervical adenopathy.  Skin:    General: Skin is warm and dry.  Neurological:     General: No focal deficit present.     Mental Status: He is alert and oriented to person, place, and time.  Psychiatric:        Mood and Affect: Mood normal.        Behavior: Behavior normal.     No results found for any visits on 07/15/24.  The ASCVD Risk score (Arnett DK, et al., 2019) failed to calculate for the following reasons:   The 2019 ASCVD risk score is only valid for ages 24 to 29   Risk score cannot be calculated because patient has a medical history  suggesting prior/existing ASCVD  {Vitals History (Optional):23777}  {Show previous labs (optional):23779}     Assessment & Plan:   Assessment and Plan Assessment & Plan      No orders of the defined types were placed in this encounter.    No orders of the defined types were placed in this encounter.   No follow-ups on file.  Corean LITTIE Ku, FNP

## 2024-07-15 ENCOUNTER — Ambulatory Visit: Admitting: Family Medicine

## 2024-07-15 DIAGNOSIS — E1165 Type 2 diabetes mellitus with hyperglycemia: Secondary | ICD-10-CM

## 2024-07-15 DIAGNOSIS — E782 Mixed hyperlipidemia: Secondary | ICD-10-CM

## 2024-07-15 DIAGNOSIS — F250 Schizoaffective disorder, bipolar type: Secondary | ICD-10-CM

## 2024-07-15 DIAGNOSIS — Z720 Tobacco use: Secondary | ICD-10-CM

## 2024-07-15 DIAGNOSIS — I10 Essential (primary) hypertension: Secondary | ICD-10-CM

## 2024-07-31 ENCOUNTER — Ambulatory Visit (HOSPITAL_COMMUNITY)

## 2024-07-31 ENCOUNTER — Encounter (HOSPITAL_COMMUNITY): Payer: Self-pay

## 2024-07-31 VITALS — BP 139/91 | HR 87 | Wt 277.0 lb

## 2024-07-31 DIAGNOSIS — F200 Paranoid schizophrenia: Secondary | ICD-10-CM | POA: Diagnosis not present

## 2024-07-31 NOTE — Progress Notes (Signed)
 Pt presents today for injection of haldol  1 ml which was tolerated in Left deltoid with no complaints. Pt will return in 28 days. PT IS DOING GOOD OVERALL

## 2024-08-28 ENCOUNTER — Encounter (HOSPITAL_COMMUNITY): Payer: Self-pay | Admitting: Psychiatry

## 2024-08-28 ENCOUNTER — Ambulatory Visit (INDEPENDENT_AMBULATORY_CARE_PROVIDER_SITE_OTHER)

## 2024-08-28 ENCOUNTER — Telehealth (INDEPENDENT_AMBULATORY_CARE_PROVIDER_SITE_OTHER): Admitting: Psychiatry

## 2024-08-28 ENCOUNTER — Encounter (HOSPITAL_COMMUNITY): Payer: Self-pay

## 2024-08-28 VITALS — BP 140/86 | Ht 67.0 in | Wt 276.0 lb

## 2024-08-28 DIAGNOSIS — F250 Schizoaffective disorder, bipolar type: Secondary | ICD-10-CM

## 2024-08-28 MED ORDER — HALOPERIDOL DECANOATE 100 MG/ML IM SOLN: 100.0000 mg | INTRAMUSCULAR | 11 refills | Status: AC

## 2024-08-28 MED ORDER — HALOPERIDOL DECANOATE 100 MG/ML IM SOLN: 100.0000 mg | INTRAMUSCULAR | 11 refills | Status: DC

## 2024-08-28 NOTE — Progress Notes (Cosign Needed)
 Pt presents today for injection of haldol  37ml/100mg , this was given in patients Right deltoid with no complaints. Pt denies all AVH, SI, and HI. Patient states that he is doing good overall, but is highly interested in  managing his weight loss and he states that he will go back to pick up BP meds. Patient states that he has not been taking his BP medications, I helped pt reorder meds from pharmacy and he states that he will pick them up. Pts BP was high at this APP at 140/86.    JNL, CMA
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
## 2024-09-01 ENCOUNTER — Other Ambulatory Visit (INDEPENDENT_AMBULATORY_CARE_PROVIDER_SITE_OTHER)

## 2024-09-01 ENCOUNTER — Ambulatory Visit (INDEPENDENT_AMBULATORY_CARE_PROVIDER_SITE_OTHER)

## 2024-09-01 DIAGNOSIS — Z79899 Other long term (current) drug therapy: Secondary | ICD-10-CM

## 2024-09-01 DIAGNOSIS — F250 Schizoaffective disorder, bipolar type: Secondary | ICD-10-CM

## 2024-09-01 NOTE — Progress Notes (Signed)
 Patient presented to office for EKG. Patient tolerated test well. Patient was informed he will hear from provider in 2-4 days about results. No complaints noted.

## 2024-09-01 NOTE — Progress Notes (Signed)
 Patient presented to the office for labs, labs were drawn from left [facility] with no issue or complaints . Pt left office alert and ambulatory.

## 2024-09-02 ENCOUNTER — Ambulatory Visit (HOSPITAL_COMMUNITY): Payer: Self-pay | Admitting: Psychiatry

## 2024-09-02 NOTE — Progress Notes (Signed)
 Provider informed patient that his EKG at this time showed normal rhythm.

## 2024-09-08 LAB — THYROID PANEL WITH TSH
Free Thyroxine Index: 2.5 (ref 1.2–4.9)
T3 Uptake Ratio: 27 % (ref 24–39)
T4, Total: 9.3 ug/dL (ref 4.5–12.0)
TSH: 1.75 u[IU]/mL (ref 0.450–4.500)

## 2024-09-08 LAB — HEPATIC FUNCTION PANEL
ALT: 8 IU/L (ref 0–44)
AST: 14 IU/L (ref 0–40)
Albumin: 5.3 g/dL — ABNORMAL HIGH (ref 4.1–5.1)
Alkaline Phosphatase: 73 IU/L (ref 47–123)
Bilirubin Total: 0.6 mg/dL (ref 0.0–1.2)
Bilirubin, Direct: 0.16 mg/dL (ref 0.00–0.40)
Total Protein: 7.4 g/dL (ref 6.0–8.5)

## 2024-09-08 LAB — PROLACTIN: Prolactin: 10 ng/mL (ref 3.9–22.7)

## 2024-09-08 LAB — SPECIMEN STATUS REPORT

## 2024-09-09 ENCOUNTER — Ambulatory Visit (HOSPITAL_COMMUNITY): Payer: Self-pay | Admitting: Psychiatry

## 2024-09-09 NOTE — Progress Notes (Signed)
 Reviewed patients labs. No concerns noted at this time.

## 2024-09-15 ENCOUNTER — Other Ambulatory Visit (HOSPITAL_COMMUNITY)

## 2024-09-25 ENCOUNTER — Encounter (HOSPITAL_COMMUNITY): Payer: Self-pay

## 2024-09-25 ENCOUNTER — Ambulatory Visit (INDEPENDENT_AMBULATORY_CARE_PROVIDER_SITE_OTHER)

## 2024-09-25 DIAGNOSIS — F259 Schizoaffective disorder, unspecified: Secondary | ICD-10-CM | POA: Diagnosis not present

## 2024-09-25 NOTE — Progress Notes (Signed)
 Pt presents today for injection of haldol  1 ml which was tolerated in right deltoid with no complaints. Pt will return in 28 days. PT IS DOING GOOD OVERALL

## 2024-10-29 ENCOUNTER — Ambulatory Visit (INDEPENDENT_AMBULATORY_CARE_PROVIDER_SITE_OTHER)

## 2024-10-29 ENCOUNTER — Encounter (HOSPITAL_COMMUNITY): Payer: Self-pay

## 2024-10-29 VITALS — BP 131/82 | HR 94 | Temp 97.8°F | Ht 67.0 in | Wt 274.2 lb

## 2024-10-29 DIAGNOSIS — F250 Schizoaffective disorder, bipolar type: Secondary | ICD-10-CM

## 2024-10-29 NOTE — Progress Notes (Signed)
 Pt presented for Injection of Haldol  100 mg    in Left Deltoid. Pt tolerated injection well. Pt denies SI/HI/AVH. No additional concerns. Pt will return in 28 days.   CJT- CMA Student

## 2024-11-26 ENCOUNTER — Ambulatory Visit (HOSPITAL_COMMUNITY)

## 2024-12-03 ENCOUNTER — Ambulatory Visit (HOSPITAL_COMMUNITY)
